# Patient Record
Sex: Male | Born: 1980 | Race: White | Hispanic: Yes | Marital: Married | Smoking: Never smoker
Health system: Southern US, Community
[De-identification: ages and names within clinical notes are randomized; demographics above are authoritative.]

## PROBLEM LIST (undated history)

## (undated) DIAGNOSIS — K602 Anal fissure, unspecified: Secondary | ICD-10-CM

## (undated) DIAGNOSIS — K37 Unspecified appendicitis: Secondary | ICD-10-CM

## (undated) HISTORY — PX: GANGLION CYST EXCISION: SHX1691

## (undated) HISTORY — PX: KNEE ARTHROSCOPY W/ ACL RECONSTRUCTION: SHX1858

## (undated) HISTORY — DX: Anal fissure, unspecified: K60.2

## (undated) HISTORY — DX: Unspecified appendicitis: K37

## (undated) HISTORY — PX: APPENDECTOMY: SHX54

---

## 2005-07-11 ENCOUNTER — Emergency Department (HOSPITAL_COMMUNITY): Admission: EM | Admit: 2005-07-11 | Discharge: 2005-07-11 | Payer: Self-pay | Admitting: Emergency Medicine

## 2009-08-09 ENCOUNTER — Telehealth: Payer: Self-pay | Admitting: Internal Medicine

## 2009-08-12 ENCOUNTER — Ambulatory Visit: Payer: Self-pay | Admitting: Internal Medicine

## 2009-08-12 DIAGNOSIS — R634 Abnormal weight loss: Secondary | ICD-10-CM

## 2009-08-12 DIAGNOSIS — K625 Hemorrhage of anus and rectum: Secondary | ICD-10-CM

## 2009-08-12 DIAGNOSIS — K5900 Constipation, unspecified: Secondary | ICD-10-CM | POA: Insufficient documentation

## 2009-08-12 DIAGNOSIS — R143 Flatulence: Secondary | ICD-10-CM

## 2009-08-12 DIAGNOSIS — R142 Eructation: Secondary | ICD-10-CM

## 2009-08-12 DIAGNOSIS — R1032 Left lower quadrant pain: Secondary | ICD-10-CM | POA: Insufficient documentation

## 2009-08-12 DIAGNOSIS — R141 Gas pain: Secondary | ICD-10-CM

## 2009-08-14 ENCOUNTER — Encounter: Payer: Self-pay | Admitting: Nurse Practitioner

## 2009-08-15 ENCOUNTER — Ambulatory Visit: Payer: Self-pay | Admitting: Internal Medicine

## 2009-08-15 ENCOUNTER — Encounter: Payer: Self-pay | Admitting: Internal Medicine

## 2009-08-19 ENCOUNTER — Telehealth (INDEPENDENT_AMBULATORY_CARE_PROVIDER_SITE_OTHER): Payer: Self-pay | Admitting: *Deleted

## 2009-08-19 ENCOUNTER — Encounter: Payer: Self-pay | Admitting: Internal Medicine

## 2009-08-21 ENCOUNTER — Telehealth: Payer: Self-pay | Admitting: Internal Medicine

## 2009-08-30 ENCOUNTER — Ambulatory Visit: Payer: Self-pay | Admitting: Internal Medicine

## 2009-08-30 ENCOUNTER — Telehealth (INDEPENDENT_AMBULATORY_CARE_PROVIDER_SITE_OTHER): Payer: Self-pay | Admitting: *Deleted

## 2009-08-30 DIAGNOSIS — K5150 Left sided colitis without complications: Secondary | ICD-10-CM | POA: Insufficient documentation

## 2009-09-10 DIAGNOSIS — R6889 Other general symptoms and signs: Secondary | ICD-10-CM | POA: Insufficient documentation

## 2009-09-27 ENCOUNTER — Ambulatory Visit: Payer: Self-pay | Admitting: Internal Medicine

## 2009-10-25 ENCOUNTER — Ambulatory Visit: Payer: Self-pay | Admitting: Internal Medicine

## 2009-10-29 ENCOUNTER — Encounter: Payer: Self-pay | Admitting: Internal Medicine

## 2009-12-24 ENCOUNTER — Ambulatory Visit: Payer: Self-pay | Admitting: Internal Medicine

## 2010-01-29 ENCOUNTER — Encounter (INDEPENDENT_AMBULATORY_CARE_PROVIDER_SITE_OTHER): Payer: Self-pay | Admitting: *Deleted

## 2010-03-27 ENCOUNTER — Ambulatory Visit: Payer: Self-pay | Admitting: Internal Medicine

## 2010-04-16 ENCOUNTER — Telehealth: Payer: Self-pay | Admitting: Internal Medicine

## 2010-04-17 ENCOUNTER — Encounter: Payer: Self-pay | Admitting: Internal Medicine

## 2010-04-17 ENCOUNTER — Telehealth (INDEPENDENT_AMBULATORY_CARE_PROVIDER_SITE_OTHER): Payer: Self-pay | Admitting: *Deleted

## 2010-04-25 ENCOUNTER — Telehealth (INDEPENDENT_AMBULATORY_CARE_PROVIDER_SITE_OTHER): Payer: Self-pay | Admitting: *Deleted

## 2010-04-30 ENCOUNTER — Encounter: Payer: Self-pay | Admitting: Internal Medicine

## 2010-08-01 ENCOUNTER — Encounter: Payer: Self-pay | Admitting: Internal Medicine

## 2010-10-16 ENCOUNTER — Encounter: Payer: Self-pay | Admitting: Internal Medicine

## 2010-11-15 ENCOUNTER — Emergency Department (HOSPITAL_COMMUNITY): Admission: EM | Admit: 2010-11-15 | Discharge: 2010-11-15 | Payer: Self-pay | Admitting: Emergency Medicine

## 2010-12-01 ENCOUNTER — Ambulatory Visit: Payer: Self-pay | Admitting: Internal Medicine

## 2010-12-02 LAB — CONVERTED CEMR LAB
ALT: 25 units/L (ref 0–53)
AST: 26 units/L (ref 0–37)
Alkaline Phosphatase: 90 units/L (ref 39–117)
BUN: 12 mg/dL (ref 6–23)
Basophils Absolute: 0 10*3/uL (ref 0.0–0.1)
CRP, High Sensitivity: 1.39 (ref 0.00–5.00)
Creatinine, Ser: 0.9 mg/dL (ref 0.4–1.5)
Eosinophils Absolute: 0 10*3/uL (ref 0.0–0.7)
HCT: 42.7 % (ref 39.0–52.0)
Hemoglobin: 14.9 g/dL (ref 13.0–17.0)
Lymphs Abs: 2.3 10*3/uL (ref 0.7–4.0)
MCHC: 34.8 g/dL (ref 30.0–36.0)
Neutro Abs: 4.5 10*3/uL (ref 1.4–7.7)
Platelets: 242 10*3/uL (ref 150.0–400.0)
RDW: 13 % (ref 11.5–14.6)
Total Bilirubin: 0.6 mg/dL (ref 0.3–1.2)

## 2011-01-20 NOTE — Medication Information (Signed)
Summary: Patient Assistance Form/[employer]  Patient Assistance Form/[employer]   Imported By: Edmonia James 05/02/2010 09:07:31  _____________________________________________________________________  External Attachment:    Type:   Image     Comment:   External Document

## 2011-01-20 NOTE — Assessment & Plan Note (Signed)
Summary: FOLLOWUP Ulcerative colitis   History of Present Illness Visit Type: Follow-up Visit Primary GI MD: [name] MD Primary Provider: Silvestre Mesi, MD  Requesting Provider: n/a Chief Complaint: Colitis symptoms have improved, very little blood noticed by patient History of Present Illness:   30 year old with left-sided ulcerative colitis diagnosed in late August 2010. He has been on a combination of oral mesalamine, 4.8 g daily, and mesalamine enemas b.i.d. Since initiating medical therapy he has made ongoing clinical improvement. He was last evaluated in the office October 25, 2009. He has continued on medical therapy without problems. He continues to report improvement in his condition. He is having some formed bowel movements, although somewhat less consistency. No bleeding or abdominal pain. Rare mucus. No appreciable medication side effects. Also, stool antigen testing for Helicobacter pylori was negative (this to clarify blood tests ordered elsewhere).   GI Review of Systems      Denies abdominal pain, acid reflux, belching, bloating, chest pain, dysphagia with liquids, dysphagia with solids, heartburn, loss of appetite, nausea, vomiting, vomiting blood, weight loss, and  weight gain.      Reports change in bowel habits.     Denies anal fissure, black tarry stools, constipation, diarrhea, diverticulosis, fecal incontinence, heme positive stool, hemorrhoids, irritable bowel syndrome, jaundice, light color stool, liver problems, rectal bleeding, and  rectal pain.    Current Medications (verified): 1)  Advil 200 Mg Tabs (Ibuprofen) .... As Needed For Headaches 2)  Lialda 1.2 Gm  Tbec (Mesalamine) .... 2 P.o. Bid 3)  Mesalamine 4 Gm  Enem (Mesalamine) .... Instill Per Rectum Every Am and Every Pm @ Bedtime 4)  Nyquil 60-7.05-19-999 Mg/47m Liqd (Pseudoeph-Doxylamine-Dm-Apap) .... As Needed For Cold and Sore Throat 5)  Ibuprofen 200 Mg Tabs (Ibuprofen) .... As  Needed  Allergies (verified): 1)  ! Codeine  Past History:  Past Medical History: Reviewed history from 08/30/2009 and no changes required. Anal Fissure ulcerative colitis  Past Surgical History: Reviewed history from 08/12/2009 and no changes required. Unremarkable  Family History: Reviewed history from 08/12/2009 and no changes required. No FH of Colon Cancer: Family History of Liver Disease/Cirrhosis: aunt Family History of Breast Cancer:aunt Aunt- Viral Hepatits / cirrrhosis. Died at 41   Social History: married No children Patient has never smoked.  Alcohol Use - Very occasional ETOH Illicit Drug Use - no Occupation: Student  Review of Systems       review of systems entirely negative  Vital Signs:  Patient profile:   30year old male Height:      68 inches Weight:      169.25 pounds BMI:     25.83 Pulse rate:   64 / minute Pulse rhythm:   regular BP sitting:   100 / 68  (left arm) Cuff size:   regular  Vitals Entered By: June McMurray CThroop(Deborra Medina (December 24, 2009 11:48 AM)  Physical Exam  General:  Well developed, well nourished, no acute distress. Head:  Normocephalic and atraumatic. Eyes:  PERRLA, no icterus. Mouth:  No deformity or lesions, dentition normal. Neck:  Supple; no masses or thyromegaly. Lungs:  Clear throughout to auscultation. Heart:  Regular rate and rhythm; no murmurs, rubs,  or bruits. Abdomen:  Soft, nontender and nondistended. No masses, hepatosplenomegaly or hernias noted. Normal bowel sounds. Pulses:  Normal pulses noted. Extremities:  no edema Neurologic:  Alert and  oriented x4; Skin:  Intact without significant lesions or rashes.. No jaundice Psych:  Alert and cooperative. Normal mood  and affect.   Impression & Recommendations:  Problem # 1:  ULCERATIVE COLITIS-LEFT SIDE (ICD-556.5) left-sided ulcerative colitis diagnosed late August 2010. Ongoing and significant clinical improvement on mesalamine  therapies.  Plan: #1. Continue Lialda 2.4 g b.i.d. #2. Change mesalamine enemas to one q.h.s. for 4 weeks, then discontinue if doing well clinically. #3. Routine office followup in 2 months. Contact the office in the interim for any questions or problems.  Problem # 2:  OTHER ABNORMAL CLINICAL FINDING (ZCH-885.0) abnormal Helicobacter pylori antibody in a patient from and an endemic area. No relevant symptoms. Stool antigen testing negative confirming false positive serology. Discussed with patient. No further workup or treatment indicated.  Patient Instructions: 1)  copy: Dr. Silvestre Mesi

## 2011-01-20 NOTE — Letter (Signed)
Summary: Generic Letter  [facility]  [place], [place] [address]   Phone: [phone]  Fax: [phone]    03/27/2010  [address] [place], [place]  [address]  To Whom It May Concern:  Mr. Alvira Monday is under Dr. Blanch Media care for the treatment of Ulcerative Colitis. Enclosed is a current medication list for the treatment of the above medical condition.   Sincerely,   [name], MD

## 2011-01-20 NOTE — Progress Notes (Signed)
  Phone Note Call from Patient   Summary of Call: Pt. brought completed application for [employer] program for Lialda. Apllication faxed to [place]. Initial call taken by: Abel Presto RN,  Apr 25, 2010 4:14 PM

## 2011-01-20 NOTE — Progress Notes (Signed)
Summary: Lialda  Phone Note Call from Patient Call back at Home Phone [phone]   Caller: Patient Call For: Dr. Henrene Pastor Reason for Call: Talk to Nurse Summary of Call: ins will not cover Lialda at 100% per pt... would like something comparable that his ins will cover Initial call taken by: Lucien Mons,  April 16, 2010 9:47 AM  Follow-up for Phone Call        Pt. instructed to call insurance and see which med. they will cover.  Abel Presto RN  April 16, 2010 9:56 AM  Pt's insurance will only pay so much a year toward name brand drugs and he has all ready exceeded it  on the [place]. Wants a generic equivalent as  the Lialda would cost $500.00 a month. Follow-up by: Abel Presto RN,  April 16, 2010 11:02 AM  Additional Follow-up for Phone Call Additional follow up Details #1::        there is no generic equivalent that I am familiar with. Please correct me if I am mistaken. By the way, sulfasalazine is not a generic equivalent. Additional Follow-up by: Irene Shipper MD,  April 16, 2010 11:18 AM    Additional Follow-up for Phone Call Additional follow up Details #2::    Pt. given discount card to take to pharmacy  and he willl call me back to see if it will be honored.Informed there is no  generic substitute.   Abel Presto RN  April 16, 2010 12:17 PM Pt. took discount card to [place] will still cost him $200.00 a month  as he is going to school and not working.Requesting to go back on mesalamine enema 1 Q.HS. Follow-up by: Abel Presto RN,  April 16, 2010 1:57 PM  Additional Follow-up for Phone Call Additional follow up Details #3:: Details for Additional Follow-up Action Taken: see if Asacol or Asacol HD are options. I wouldn't favor chronic topical therapy alone Additional Follow-up by: Irene Shipper MD,  April 16, 2010 2:17 PM  Pt. will call insurance co. about Asacol coverage.   Abel Presto RN  April 16, 2010 2:23 PM

## 2011-01-20 NOTE — Progress Notes (Signed)
  Phone Note Outgoing Call   Summary of Call: Call placed to [facility] cares per rec. of [employer] [place]. rep. and pt. will qualify for asst.program according to financial guidelines.He will come by tomorrow for the application.

## 2011-01-20 NOTE — Medication Information (Signed)
Summary: Lialda Approved/[employer]  Lialda/[employer]   Imported By: Phillis Knack 05/07/2010 14:34:29  _____________________________________________________________________  External Attachment:    Type:   Image     Comment:   External Document

## 2011-01-20 NOTE — Letter (Signed)
Summary: Office Visit Letter  [facility]  [address] [place], [place] [address]   Phone: [phone]  Fax: [phone]      January 29, 2010 MRN: [id number]   GI[address] FOR[address] [place], [place]  [address]   Dear Mr. Alvira Monday,   According to our records, it is time for you to schedule a follow-up office visit with [place] in the month of March 2011.   At your convenience, please call [phone] (option #2)to schedule an office visit. If you have any questions, concerns, or feel that this letter is in error, we would appreciate your call.   Sincerely,  Docia Chuck. Henrene Pastor, M.D.   [facility] Gastroenterology Division [phone]

## 2011-01-20 NOTE — Medication Information (Signed)
Summary: Lialda/[employer]  Lialda/[employer]   Imported By: Phillis Knack 08/07/2010 12:22:41  _____________________________________________________________________  External Attachment:    Type:   Image     Comment:   External Document

## 2011-01-20 NOTE — Medication Information (Signed)
Summary: [employer]  [employer]   Imported By: Bubba Hales 10/23/2010 10:24:38  _____________________________________________________________________  External Attachment:    Type:   Image     Comment:   External Document

## 2011-01-20 NOTE — Assessment & Plan Note (Signed)
Summary: Followup - left-sided UC   History of Present Illness Visit Type: Follow-up Visit Primary GI MD: [name] MD Primary Provider: [name], MD  Requesting Provider: n/a Chief Complaint: F/u for ulcerative colitis.  Pt states that he feels good and denies any GI complaints  History of Present Illness:   30 year old with left-sided ulcerative colitis diagnosed in late August 2010. He has been on a combination of oral mesalamine, 4.8 g daily, and mesalamine enemas at night. He was last seen in January. Since that time he continues to do well. He reports 2 formed bowel movements daily. No blood or mucus except for one occasion. No abdominal pain. His weight has returned to baseline. He continues to use NSAIDs sporadically. He is going to [place] for one month. He asks if he can stop mesalamine enemas. He was instructed previous that he could.   GI Review of Systems      Denies abdominal pain, acid reflux, belching, bloating, chest pain, dysphagia with liquids, dysphagia with solids, heartburn, loss of appetite, nausea, vomiting, vomiting blood, weight loss, and  weight gain.        Denies anal fissure, black tarry stools, change in bowel habit, constipation, diarrhea, diverticulosis, fecal incontinence, heme positive stool, hemorrhoids, irritable bowel syndrome, jaundice, light color stool, liver problems, rectal bleeding, and  rectal pain.    Current Medications (verified): 1)  Advil 200 Mg Tabs (Ibuprofen) .... As Needed For Headaches 2)  Lialda 1.2 Gm  Tbec (Mesalamine) .... 2 P.o. Bid 3)  Mesalamine 4 Gm  Enem (Mesalamine) .... Instill Per Rectum Every Am and Every Pm @ Bedtime 4)  Ibuprofen 200 Mg Tabs (Ibuprofen) .... As Needed  Allergies (verified): 1)  ! Codeine  Past History:  Past Medical History: Anal Fissure Ulcerative Colitis  Past Surgical History: Reviewed history from 08/12/2009 and no changes required. Unremarkable  Family History: Reviewed  history from 08/12/2009 and no changes required. No FH of Colon Cancer: Family History of Liver Disease/Cirrhosis: aunt Family History of Breast Cancer:aunt Aunt- Viral Hepatits / cirrrhosis. Died at 10.   Social History: Unemployed---------------Student Married  No children Patient has never smoked.  Alcohol Use - Very occasional ETOH Illicit Drug Use - no  Review of Systems  The patient denies allergy/sinus, anemia, anxiety-new, arthritis/joint pain, back pain, blood in urine, breast changes/lumps, change in vision, confusion, cough, coughing up blood, depression-new, fainting, fatigue, fever, headaches-new, hearing problems, heart murmur, heart rhythm changes, itching, muscle pains/cramps, night sweats, nosebleeds, shortness of breath, skin rash, sleeping problems, sore throat, swelling of feet/legs, swollen lymph glands, thirst - excessive, urination - excessive, urination changes/pain, urine leakage, vision changes, and voice change.    Vital Signs:  Patient profile:   30 year old male Height:      68 inches Weight:      170 pounds BMI:     25.94 Pulse rate:   60 / minute Pulse rhythm:   regular BP sitting:   110 / 76  (left arm) Cuff size:   regular  Vitals Entered By: Hope Pigeon CMA (March 27, 2010 11:32 AM)  Physical Exam  General:  Well developed, well nourished, no acute distress. Head:  Normocephalic and atraumatic. Eyes:  PERRLA, no icterus. Mouth:  No deformity or lesions, dentition normal. Lungs:  Clear throughout to auscultation. Heart:  Regular rate and rhythm; no murmurs, rubs,  or bruits. Abdomen:  Soft, nontender and nondistended. No masses, hepatosplenomegaly or hernias noted. Normal bowel sounds. Msk:  Symmetrical with no gross deformities. Normal posture. Pulses:  Normal pulses noted. Extremities:  No clubbing, cyanosis, edema or deformities noted. Neurologic:  Alert and  oriented x4;   Skin:  no jaundice Psych:  Alert and cooperative. Normal mood  and affect.   Impression & Recommendations:  Problem # 1:  ULCERATIVE COLITIS-LEFT SIDE (ICD-556.5) Left-sided ulcerative colitis. Clinically doing well on mesalamine therapies.  Plan #1. Continue Lialda 2.4 g b.i.d. #2. Discontinue mesalamine enemas #3. Given medical note stating that he may travel with his medication #4. No NSAIDs. May use Tylenol or Tylenol products as directed p.r.n. #5. Routine GI office followup in 6 months. Contact the office in the interim for questions or problems.  Patient Instructions: 1)  Continue taking Lialda. 2)  Stop Enemas. 3)  Do not use Advil/Ibuprofen. 4)  You can take Tylenol. 5)  Please schedule a follow-up appointment in 6 months. 6)  The medication list was reviewed and reconciled.  All changed / newly prescribed medications were explained.  A complete medication list was provided to the patient / caregiver. 7)  pritned and given to patient. Randye Lobo [facility]  March 27, 2010 11:51 AM 8)  copy: Dr. Silvestre Mesi

## 2011-01-22 NOTE — Assessment & Plan Note (Signed)
Summary: 6 month followup-left-sided UC   History of Present Illness Visit Type: follow up Primary GI MD: George Shorts MD Primary Provider: Gay Morrison. Copland, MD  Requesting Provider: n/a Chief Complaint: Follow up Ulcerative Colitis. Pt has some intermittant cramping with loose and sometimes watery BM's. Pt denies any abd pain. History of Present Illness:   30 year old with left-sided ulcerative colitis diagnosed in August of 2010. He was last evaluated March 27, 2010. Since that time he has continued on oral mesalamine 4.8 g daily. He reports no significant problems since his last visit. He did have a day or 2 with intermittent abdominal cramping and loose stools. Overall though, 2 formed bowel movements per day without mucus or blood. No abdominal pain. Stable weight.   GI Review of Systems      Denies abdominal pain, acid reflux, belching, bloating, chest pain, dysphagia with liquids, dysphagia with solids, heartburn, loss of appetite, nausea, vomiting, vomiting blood, weight loss, and  weight gain.        Denies anal fissure, black tarry stools, change in bowel habit, constipation, diarrhea, diverticulosis, fecal incontinence, heme positive stool, hemorrhoids, irritable bowel syndrome, jaundice, light color stool, liver problems, rectal bleeding, and  rectal pain.    Current Medications (verified): 1)  Advil 200 Mg Tabs (Ibuprofen) .... As Needed For Headaches 2)  Lialda 1.2 Gm  Tbec (Mesalamine) .... 2 P.o. Bid 3)  Ibuprofen 200 Mg Tabs (Ibuprofen) .... As Needed  Allergies (verified): 1)  ! Codeine  Past History:  Past Medical History: Reviewed history from 03/27/2010 and no changes required. Anal Fissure Ulcerative Colitis  Past Surgical History: Reviewed history from 08/12/2009 and no changes required. Unremarkable  Family History: Reviewed history from 08/12/2009 and no changes required. No FH of Colon Cancer: Family History of Liver Disease/Cirrhosis: aunt Family  History of Breast Cancer:aunt Aunt- Viral Hepatits / cirrrhosis. Died at 48.   Social History: Reviewed history from 03/27/2010 and no changes required. Unemployed---------------Student Married  No children Patient has never smoked.  Alcohol Use - Very occasional ETOH Illicit Drug Use - no  Review of Systems  The patient denies allergy/sinus, anemia, anxiety-new, arthritis/joint pain, back pain, blood in urine, breast changes/lumps, change in vision, confusion, cough, coughing up blood, depression-new, fainting, fatigue, fever, headaches-new, hearing problems, heart murmur, heart rhythm changes, itching, menstrual pain, muscle pains/cramps, night sweats, nosebleeds, pregnancy symptoms, shortness of breath, skin rash, sleeping problems, sore throat, swelling of feet/legs, swollen lymph glands, thirst - excessive , urination - excessive , urination changes/pain, urine leakage, vision changes, and voice change.    Vital Signs:  Patient profile:   30 year old male Height:      68 inches Weight:      174.25 pounds BMI:     26.59 Pulse rate:   66 / minute Pulse rhythm:   regular BP sitting:   116 / 68  (left arm) Cuff size:   regular  Vitals Entered By: George Morrison CMA George Morrison) (December 01, 2010 4:09 PM)  Physical Exam  General:  Well developed, well nourished, no acute distress. Head:  Normocephalic and atraumatic. Eyes:  PERRLA, no icterus. Mouth:  No deformity or lesions, dentition normal. Lungs:  Clear throughout to auscultation. Heart:  Regular rate and rhythm; no murmurs, rubs,  or bruits. Abdomen:  Soft, nontender and nondistended. No masses, hepatosplenomegaly or hernias noted. Normal bowel sounds. Pulses:  Normal pulses noted. Extremities:  edema Neurologic:  alert origin Skin:  no jaundice Psych:  Alert and cooperative.  Normal mood and affect.   Impression & Recommendations:  Problem # 1:  ULCERATIVE COLITIS-LEFT SIDE (ICD-556.5) clinically doing  well  Plan: #1. Continue Lialda 2.4 g b.i.d. Prescription with multiple refills submitted #2. CBC, comprehensive metabolic panel, and CRP today #3. Routine GI followup in 6 months. Sooner for questions or clinical problems.  Other Orders: TLB-CBC Platelet - w/Differential (85025-CBCD) TLB-CRP-High Sensitivity (C-Reactive Protein) (86140-FCRP) TLB-CMP (Comprehensive Metabolic Pnl) (15176-HYWV)  Patient Instructions: 1)  Go to basement floor today and have labs drawn. 2)  Refilled Lialda Rx. sent to [facility]. 3)  Please schedule a follow-up appointment in 6 months. 4)  The medication list was reviewed and reconciled.  All changed / newly prescribed medications were explained.  A complete medication list was provided to the patient / caregiver. Prescriptions: LIALDA 1.2 GM  TBEC (MESALAMINE) 2 p.o. bid  #360 x 4   Entered by:   Randye Lobo NCMA   Authorized by:   Irene Shipper MD   Signed by:   Randye Lobo NCMA on 12/01/2010   Method used:   Electronically to        [facility]* (retail)       [address]       [place]       [place], [place]  [address]       Ph: [phone]       Fax: [phone]   RxID:   [id number]

## 2011-01-22 NOTE — Medication Information (Signed)
Summary: Pt's assistance forms for Lialda/[employer]  Pt's assistance forms for Lialda/[employer]   Imported By: Phillis Knack 12/08/2010 10:27:37  _____________________________________________________________________  External Attachment:    Type:   Image     Comment:   External Document

## 2011-01-30 ENCOUNTER — Telehealth: Payer: Self-pay | Admitting: Internal Medicine

## 2011-02-05 NOTE — Progress Notes (Signed)
Summary: Medication refill  Phone Note Call from Patient Call back at Home Phone [phone]   Caller: Patient Call For: Dr. Henrene Pastor Reason for Call: Talk to Nurse Summary of Call: refill on lialda to [facility] outpatient pharmacy, patient would like a 3 month supply Initial call taken by: Martinique Johnson,  January 30, 2011 3:34 PM    Prescriptions: LIALDA 1.2 GM  TBEC (MESALAMINE) 2 p.o. bid  #360 x 4   Entered by:   Randye Lobo NCMA   Authorized by:   Irene Shipper MD   Signed by:   Randye Lobo NCMA on 01/30/2011   Method used:   Electronically to        [facility]* (retail)       [address]       [place]       [place], [place]  [address]       Ph: [phone]       Fax: [phone]   RxID:   [id number]

## 2011-05-15 ENCOUNTER — Telehealth: Payer: Self-pay | Admitting: Internal Medicine

## 2011-05-15 NOTE — Telephone Encounter (Signed)
Pt aware of Dr. Ardis Hughs recommendations. Pt states he has 5 boxes of the Rowasa enemas already and he will use those. Pt knows to call back if no improvement within a week. Appt made for pt to see Dr. Henrene Pastor 06/12/11@1 :30pm. Pt verbalized understanding.

## 2011-05-15 NOTE — Telephone Encounter (Signed)
Had left sided colitis per Dr. Per office note December 2011. Let's try him on Rowasa enema, once nightly at bed. He is given 30 of these with 2 refills. He should call if his symptoms worsen or do not improve within the next week or so. He should also have follow up appointment Dr. Henrene Pastor in 2-3 weeks.

## 2011-05-15 NOTE — Telephone Encounter (Signed)
Pt states that he thinks he is having a flare of his ulcerative colitis. Pt states he has had mucous and blood in his stool for a week. Pt is currently taking Lialda 2.4gm BID. Dr. Ardis Hughs as doc of the day please advise.

## 2011-05-19 NOTE — Telephone Encounter (Signed)
Thanks

## 2011-05-30 ENCOUNTER — Encounter: Payer: Self-pay | Admitting: Gastroenterology

## 2011-06-05 ENCOUNTER — Encounter: Payer: Self-pay | Admitting: Internal Medicine

## 2011-06-08 ENCOUNTER — Encounter: Payer: Self-pay | Admitting: Internal Medicine

## 2011-06-08 ENCOUNTER — Ambulatory Visit (INDEPENDENT_AMBULATORY_CARE_PROVIDER_SITE_OTHER): Payer: 59 | Admitting: Internal Medicine

## 2011-06-08 VITALS — BP 120/84 | HR 76 | Temp 98.0°F | Resp 16 | Ht 68.0 in | Wt 172.2 lb

## 2011-06-08 DIAGNOSIS — M6740 Ganglion, unspecified site: Secondary | ICD-10-CM

## 2011-06-08 DIAGNOSIS — M67439 Ganglion, unspecified wrist: Secondary | ICD-10-CM | POA: Insufficient documentation

## 2011-06-08 NOTE — Assessment & Plan Note (Addendum)
I referred him to ortho (hand) surgery to consider removal

## 2011-06-08 NOTE — Progress Notes (Signed)
  Subjective:    Patient ID: George Morrison, male    DOB: November 04, 1981, 30 y.o.   MRN: [id number]  HPI New to me he complains of an uncomfortable cyst on the back of his left wrist for several years. It is slowly getting larger. 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    Review of Systems  Constitutional: Negative.   HENT: Negative.   Eyes: Negative.   Respiratory: Negative.   Cardiovascular: Negative.   Gastrointestinal: Negative.   Genitourinary: Negative.   Musculoskeletal: Negative.   Skin: Negative.   Neurological: Negative.   Hematological: Negative.   Psychiatric/Behavioral: Negative.        Objective:   Physical Exam  Vitals reviewed. Constitutional: He appears well-developed and well-nourished. No distress.  HENT:  Head: Normocephalic and atraumatic.  Right Ear: External ear normal.  Nose: Nose normal.  Mouth/Throat: No oropharyngeal exudate.  Eyes: Conjunctivae and EOM are normal. Pupils are equal, round, and reactive to light. Right eye exhibits no discharge. Left eye exhibits no discharge. No scleral icterus.  Neck: Normal range of motion. Neck supple. No JVD present. No tracheal deviation present. No thyromegaly present.  Cardiovascular: Normal rate, regular rhythm, normal heart sounds and intact distal pulses.  Exam reveals no gallop and no friction rub.   No murmur heard. Pulmonary/Chest: Effort normal and breath sounds normal. No stridor. No respiratory distress. He has no wheezes. He has no rales. He exhibits no tenderness.  Abdominal: Soft. Bowel sounds are normal. He exhibits no distension and no mass. There is no tenderness. There is no rebound and no guarding.  Musculoskeletal: Normal range of motion. He exhibits no edema and no tenderness.       On the dorsum of the left wrist there is a ganglion that measures about 2 cm, it is fixed to a tendon but it moves freely and there is no ttp, fluctuance, induration, or  abnormal skin.  Lymphadenopathy:    He has no cervical adenopathy.  Neurological: He is alert. He has normal reflexes.  Skin: Skin is warm and dry. No rash noted. He is not diaphoretic. No erythema. No pallor.  Psychiatric: He has a normal mood and affect. His behavior is normal. Judgment and thought content normal.          Assessment & Plan:

## 2011-06-08 NOTE — Patient Instructions (Signed)
Ganglion A ganglion is a swelling under the skin that is filled with a thick, jelly-like substance. It is a synovial cyst. This is caused by a break (rupture) of the joint lining from the joint space. A ganglion often occurs near an area of repeated minor trauma (damage caused by an accident). Trauma may also be a repetitive movement at work or in a sport. TREATMENT It often goes away without treatment. It may reappear later. Sometimes a ganglion may need to be surgically removed. Often they are drained and injected with a steroid. Sometimes they respond to:  Rest.   Occasionally splinting helps.  HOME CARE INSTRUCTIONS  Your caregiver will decide the best way of treating your ganglion. Do not try to break the ganglion yourself by pressing on it, poking it with a needle, or hitting it with a heavy object.   Use medications as directed.  1.  2. SEEK MEDICAL CARE IF:   The ganglion becomes larger or more painful.   You have increased redness or swelling.   You have weakness or numbness in your hand or wrist.  MAKE SURE YOU:   Understand these instructions.   Will watch your condition.   Will get help right away if you are not doing well or get worse.  Document Released: 12/04/2000 Document Re-Released: 03/05/2009 [facility] Patient Information 2011 [place].

## 2011-06-12 ENCOUNTER — Ambulatory Visit: Payer: Self-pay | Admitting: Internal Medicine

## 2011-07-08 ENCOUNTER — Other Ambulatory Visit: Payer: Self-pay | Admitting: Orthopedic Surgery

## 2011-07-08 ENCOUNTER — Ambulatory Visit (HOSPITAL_BASED_OUTPATIENT_CLINIC_OR_DEPARTMENT_OTHER)
Admission: RE | Admit: 2011-07-08 | Discharge: 2011-07-08 | Disposition: A | Discharge status: Home / Self Care | Payer: 59 | Source: Ambulatory Visit | Attending: Orthopedic Surgery | Admitting: Orthopedic Surgery

## 2011-07-08 DIAGNOSIS — Z01812 Encounter for preprocedural laboratory examination: Secondary | ICD-10-CM | POA: Insufficient documentation

## 2011-07-08 DIAGNOSIS — M6740 Ganglion, unspecified site: Secondary | ICD-10-CM | POA: Insufficient documentation

## 2011-07-08 LAB — POCT HEMOGLOBIN-HEMACUE: Hemoglobin: 14.4 g/dL (ref 13.0–17.0)

## 2011-07-13 NOTE — Op Note (Signed)
NAMEGenice Rouge NO.:  [id number]  MEDICAL RECORD NO.:  [id number]  LOCATION:                                 FACILITY:  PHYSICIAN:  Melrose Nakayama, MD       DATE OF BIRTH:  DATE OF PROCEDURE:  07/08/2011 DATE OF DISCHARGE:                              OPERATIVE REPORT   PREOPERATIVE DIAGNOSIS:  Left wrist dorsal mass.  POSTOPERATIVE DIAGNOSIS:  Left wrist dorsal mass.  ATTENDING PHYSICIAN:  Melrose Nakayama, MD, who scrubbed and present for the entire procedure.  ASSISTANT SURGEON:  None.  SURGICAL PROCEDURES: 1. Left wrist deep mass removal greater than 3 cm subfascial mass. 2. Left wrist joint arthrotomy and exploration.  ANESTHESIA:  General via LMA.  TOURNIQUET TIME:  Less than 30 minutes at 250 mmHg.  INTRAOPERATIVE SPECIMENS:  Dorsal mass to pathology.  SURGICAL AND INDICATIONS:  [name] is a right-hand dominant 30 year old gentleman with recurrent mass over the dorsal aspect of his wrist. The patient elected to undergo removal.  Risks, benefits and alternatives were discussed in detail with the patient, and signed informed consent was obtained.  Risks include but not limited bleeding, infection, damage to nearby nerves, arteries or tendons, recurrence of the mass and need for further surgical intervention.  DESCRIPTION OF PROCEDURE:  The patient was appropriately identified in the preop holding area, a mark with permanent marker was made on the left wrist to indicate the correct operative site.  The patient was then brought back to the operating room and placed supine on the anesthesia table where general anesthesia was administered.  The patient tolerated this well.  Well-padded tourniquet was then placed in the left brachium and sealed with 1000 drape.  Left upper extremity was prepped and draped in normal sterile fashion.  Time-out was called, the correct side was identified and the procedure begun.  Attention was then  turned to the left wrist where a longitudinal incision was made directly over the mass.  The limb was elevated and tourniquet insufflated.  Dissection was then carried down through subcutaneous tissues.  Subcutaneous veins were then carefully retracted.  Dissection was then carried down between the extensor interval down to the wrist joint above the fascial layer. Following this, a circumferential dissection was then carried out of the approximately 3 cm mass.  Once circumferential dissection was then carried around the mass, the mass was traced all the way down to its stalk into the joint.  Joint arthrotomy was then carried out.  This was the mass originating from the SL interval.  The joint was then excised at the base of the originating stalk and sent to pathology.  Further joint exploration and wound exploration and joint evaluation was evaluated.  No other abnormalities noted within the joint.  The patient did have some mild synovitis around the area, but no other soft tissue abnormalities noted.  Following mass removal with the joint capsulotomy and arthrotomy, skin was then closed loosely with a 3-0 Monocryl suture. The subcutaneous tissues were then closed with 3-0 Monocryl, 4-0 Monocryl suture and skin was closed with interrupted 4-0 Prolene sutures.  Adaptic  dressing and sterile compressive bandage were then applied.  The patient was placed in a well-padded volar splint. Extubated and taken to recovery room in good condition.  POSTOPERATIVE PLAN:  The patient was discharged home, seen back in the office in approximately 2 weeks for wound check, suture removal, transition to short-arm wrist brace and then begin gradually use in activity.     Melrose Nakayama, MD     FWO/MEDQ  D:  07/08/2011  T:  07/09/2011  Job:  [id number]  Electronically Signed by Iran Planas IV MD on 07/13/2011 09:58:12 PM

## 2012-04-13 ENCOUNTER — Telehealth: Payer: Self-pay

## 2012-04-13 MED ORDER — MESALAMINE 1.2 G PO TBEC: DELAYED_RELEASE_TABLET | ORAL | Status: DC

## 2012-04-13 NOTE — Telephone Encounter (Signed)
refill request for lialda

## 2012-05-03 ENCOUNTER — Other Ambulatory Visit (INDEPENDENT_AMBULATORY_CARE_PROVIDER_SITE_OTHER): Payer: 59

## 2012-05-03 ENCOUNTER — Encounter: Payer: Self-pay | Admitting: Internal Medicine

## 2012-05-03 ENCOUNTER — Other Ambulatory Visit: Payer: Self-pay | Admitting: Internal Medicine

## 2012-05-03 ENCOUNTER — Ambulatory Visit (INDEPENDENT_AMBULATORY_CARE_PROVIDER_SITE_OTHER): Payer: Self-pay | Admitting: Internal Medicine

## 2012-05-03 DIAGNOSIS — K5190 Ulcerative colitis, unspecified, without complications: Secondary | ICD-10-CM

## 2012-05-03 LAB — CBC WITH DIFFERENTIAL/PLATELET
Basophils Relative: 0.3 % (ref 0.0–3.0)
Eosinophils Relative: 0.6 % (ref 0.0–5.0)
HCT: 46.7 % (ref 39.0–52.0)
Hemoglobin: 15.6 g/dL (ref 13.0–17.0)
Lymphs Abs: 2.3 10*3/uL (ref 0.7–4.0)
Monocytes Relative: 6.5 % (ref 3.0–12.0)
Neutro Abs: 3.5 10*3/uL (ref 1.4–7.7)
Platelets: 250 10*3/uL (ref 150.0–400.0)
RBC: 5.03 Mil/uL (ref 4.22–5.81)
WBC: 6.3 10*3/uL (ref 4.5–10.5)

## 2012-05-03 LAB — COMPREHENSIVE METABOLIC PANEL
ALT: 26 U/L (ref 0–53)
AST: 23 U/L (ref 0–37)
Albumin: 4.4 g/dL (ref 3.5–5.2)
Alkaline Phosphatase: 93 U/L (ref 39–117)
BUN: 15 mg/dL (ref 6–23)
CO2: 28 mEq/L (ref 19–32)
Calcium: 9.7 mg/dL (ref 8.4–10.5)
Chloride: 104 mEq/L (ref 96–112)
Creatinine, Ser: 0.8 mg/dL (ref 0.4–1.5)
GFR: 118.98 mL/min (ref >60.00)
Glucose, Bld: 95 mg/dL (ref 70–99)
Potassium: 4.7 mEq/L (ref 3.5–5.1)
Sodium: 139 mEq/L (ref 135–145)
Total Bilirubin: 0.9 mg/dL (ref 0.3–1.2)
Total Protein: 8 g/dL (ref 6.0–8.3)

## 2012-05-03 LAB — SEDIMENTATION RATE: Sed Rate: 6 mm/hr (ref 0–22)

## 2012-05-03 MED ORDER — MESALAMINE 4 G RE ENEM: 4.0000 g | ENEMA | Freq: Every day | RECTAL | Status: DC

## 2012-05-03 MED ORDER — MESALAMINE 1.2 G PO TBEC: DELAYED_RELEASE_TABLET | ORAL | Status: DC

## 2012-05-03 NOTE — Progress Notes (Signed)
HISTORY OF PRESENT ILLNESS:  George Morrison is a 31 y.o. male with ulcerative colitis involving the distal 30 cm of the colon as well as focal colitis in the region of the hepatic flexure. This was diagnosed August 2010. History of medical therapies and improved. He did not require steroids. He has not been seen since December of 2011. At that time he was doing well. He presents today after having requested a refill of his medication, and realizing that had been over a year since his last visit. He was to have returned about 1 year ago. In any event, he is doing well. He takes Lialda 2.4 g twice a day. Occasionally, Rowasa enemas for short episodes of mucoid stools with minimal bleeding. Overall he reports that he has been doing extremely well. George Morrison are occasional increased intestinal gas, and rare episodes of mucus and minor blood for 2-3 days. His weight is stable. No interval medical problems. He continues to be active playing soccer.  REVIEW OF SYSTEMS:  All non-GI ROS negative except for back pain and headaches  Past Medical History  Diagnosis Date  . Anal fissure   . Ulcerative colitis     Past Surgical History  Procedure Date  . Ganglion cyst excision     left hand    Social History George Morrison  reports that he has never smoked. He has never used smokeless tobacco. He reports that he does not drink alcohol or use illicit drugs.  family history includes Cancer in his other; Cirrhosis in his other; Hepatitis in his other; and Liver disease in his other.  Allergies  Allergen Reactions  . Codeine        PHYSICAL EXAMINATION: Vital signs: BP 104/62  Pulse 62  Ht 5' 8"  (1.727 m)  Wt 169 lb 2 oz (76.715 kg)  BMI 25.72 kg/m2 General: Well-developed, well-nourished, no acute distress HEENT: Sclerae are anicteric, conjunctiva pink. Oral mucosa intact Lungs: Clear Heart: Regular Abdomen: soft, nontender, nondistended, no obvious ascites, no peritoneal signs,  normal bowel sounds. No organomegaly. Extremities: No edema Psychiatric: alert and oriented x3. Cooperative      ASSESSMENT:  #1. Ulcerative colitis. Quiescent on mesalamine   PLAN:  #1. Continue Lialda at current dosage. Prescription refilled for one year #2. Rowasa enemas when necessary. Prescription refilled #3. Blood work today including CBC, comprehensive metabolic panel, and erythrocyte sedimentation rate #4. Routine office followup in 1 year. Sooner if needed  ADDENDUM Blood work has returned today. It is entirely normal.

## 2012-05-03 NOTE — Patient Instructions (Signed)
Your physician has requested that you go to the basement for lab work before leaving today  We have sent the following medications to your pharmacy for you to pick up at your convenience:  Lialda, Rowasa enemas.  Please follow up in one year

## 2012-05-11 ENCOUNTER — Telehealth: Payer: Self-pay

## 2012-05-11 ENCOUNTER — Other Ambulatory Visit: Payer: Self-pay

## 2012-05-11 NOTE — Telephone Encounter (Signed)
called in rx for Lialda;

## 2012-10-23 ENCOUNTER — Emergency Department (HOSPITAL_COMMUNITY): Payer: 59

## 2012-10-23 ENCOUNTER — Emergency Department (HOSPITAL_COMMUNITY)
Admission: EM | Admit: 2012-10-23 | Discharge: 2012-10-23 | Disposition: A | Discharge status: Home / Self Care | Payer: 59 | Attending: Emergency Medicine | Admitting: Emergency Medicine

## 2012-10-23 ENCOUNTER — Encounter (HOSPITAL_COMMUNITY): Payer: Self-pay

## 2012-10-23 DIAGNOSIS — K5190 Ulcerative colitis, unspecified, without complications: Secondary | ICD-10-CM | POA: Insufficient documentation

## 2012-10-23 DIAGNOSIS — Z79899 Other long term (current) drug therapy: Secondary | ICD-10-CM | POA: Insufficient documentation

## 2012-10-23 DIAGNOSIS — Y9289 Other specified places as the place of occurrence of the external cause: Secondary | ICD-10-CM | POA: Insufficient documentation

## 2012-10-23 DIAGNOSIS — Y9366 Activity, soccer: Secondary | ICD-10-CM | POA: Insufficient documentation

## 2012-10-23 DIAGNOSIS — X503XXA Overexertion from repetitive movements, initial encounter: Secondary | ICD-10-CM | POA: Insufficient documentation

## 2012-10-23 DIAGNOSIS — K602 Anal fissure, unspecified: Secondary | ICD-10-CM | POA: Insufficient documentation

## 2012-10-23 DIAGNOSIS — S43006A Unspecified dislocation of unspecified shoulder joint, initial encounter: Secondary | ICD-10-CM | POA: Insufficient documentation

## 2012-10-23 MED ORDER — ONDANSETRON HCL 4 MG/2ML IJ SOLN: 4.0000 mg | Freq: Once | INTRAMUSCULAR | Status: DC

## 2012-10-23 MED ORDER — ONDANSETRON HCL 4 MG/2ML IJ SOLN
INTRAMUSCULAR | Status: AC
  Filled 2012-10-23: qty 2

## 2012-10-23 MED ORDER — FENTANYL CITRATE 0.05 MG/ML IJ SOLN
50.0000 ug | Freq: Once | INTRAMUSCULAR | Status: AC
  Administered 2012-10-23: 50 ug via INTRAVENOUS
  Filled 2012-10-23: qty 2

## 2012-10-23 MED ORDER — LORAZEPAM 2 MG/ML IJ SOLN
1.0000 mg | Freq: Once | INTRAMUSCULAR | Status: AC
  Administered 2012-10-23: 1 mg via INTRAVENOUS
  Filled 2012-10-23: qty 1

## 2012-10-23 MED ORDER — ONDANSETRON HCL 4 MG/2ML IJ SOLN
4.0000 mg | Freq: Once | INTRAMUSCULAR | Status: AC
  Administered 2012-10-23: 4 mg via INTRAVENOUS

## 2012-10-23 MED ORDER — HYDROMORPHONE HCL PF 1 MG/ML IJ SOLN
1.0000 mg | Freq: Once | INTRAMUSCULAR | Status: AC
  Administered 2012-10-23: 1 mg via INTRAVENOUS
  Filled 2012-10-23: qty 1

## 2012-10-23 MED ORDER — HYDROCODONE-ACETAMINOPHEN 5-500 MG PO TABS: 1.0000 | ORAL_TABLET | Freq: Four times a day (QID) | ORAL | Status: DC | PRN

## 2012-10-23 MED ORDER — ONDANSETRON HCL 8 MG PO TABS: 8.0000 mg | ORAL_TABLET | Freq: Three times a day (TID) | ORAL | Status: DC | PRN

## 2012-10-23 NOTE — ED Notes (Signed)
Pt sts he was playing soccer and fell on Left side with arm extended.  Presents with obvious deformity to Left shoulder.  Pt denies numbness or tingling in fingers.  Pain score 10/10.

## 2012-10-23 NOTE — ED Provider Notes (Signed)
History     CSN: [id number]  Arrival date & time 10/23/12  1215   First MD Initiated Contact with Patient 10/23/12 1304      Chief Complaint  Patient presents with  . Shoulder Injury    (Consider location/radiation/quality/duration/timing/severity/associated sxs/prior treatment) Patient is a 31 y.o. male presenting with shoulder injury. The history is provided by the patient.  Shoulder Injury Pertinent negatives include no chest pain, no abdominal pain, no headaches and no shortness of breath.  pt playing soccer, fell onto left shoulder w arm extended. C/o left shoulder pain. Constant, dull, mod-severe. Worse w movement. No numbness/weakness. Denies prior shoulder injury. Is right hand dominant. No head injury or loc. No neck or back pain.      Past Medical History  Diagnosis Date  . Anal fissure   . Ulcerative colitis     Past Surgical History  Procedure Date  . Ganglion cyst excision     left hand    Family History  Problem Relation Age of Onset  . Liver disease Other   . Cancer Other     Breast cancer/aunt  . Cirrhosis Other     aunt died at 56  . Hepatitis Other     viral hepatitis    History  Substance Use Topics  . Smoking status: Never Smoker   . Smokeless tobacco: Never Used  . Alcohol Use: Yes     Comment: very occasional      Review of Systems  Constitutional: Negative for fever.  HENT: Negative for neck pain.   Eyes: Negative for redness.  Respiratory: Negative for shortness of breath.   Cardiovascular: Negative for chest pain.  Gastrointestinal: Negative for abdominal pain.  Genitourinary: Negative for flank pain.  Musculoskeletal: Negative for back pain.  Skin: Negative for wound.  Neurological: Negative for weakness, numbness and headaches.  Hematological: Does not bruise/bleed easily.  Psychiatric/Behavioral: Negative for confusion.    Allergies  Codeine  Home Medications   Current Outpatient Rx  Name  Route  Sig  Dispense   Refill  . IBUPROFEN 200 MG PO TABS   Oral   Take 200 mg by mouth as needed.           Marland Kitchen MESALAMINE 1.2 G PO TBEC      Take 2 tablets by mouth twice a day   360 tablet   3     Patient needs office visit for further refills!!!   . MESALAMINE 4 G RE ENEM   Rectal   Place 60 mLs (4 g total) rectally at bedtime.   30 Bottle   3     BP 110/81  Pulse 76  Temp 97.5 F (36.4 C) (Oral)  Resp 18  SpO2 100%  Physical Exam  Nursing note and vitals reviewed. Constitutional: He is oriented to person, place, and time. He appears well-developed and well-nourished. No distress.  HENT:  Head: Atraumatic.  Eyes: Conjunctivae normal are normal.  Neck: Neck supple. No tracheal deviation present.  Cardiovascular: Normal rate, regular rhythm, normal heart sounds and intact distal pulses.   Pulmonary/Chest: Effort normal and breath sounds normal. No accessory muscle usage. No respiratory distress. He exhibits no tenderness.  Abdominal: Soft. He exhibits no distension. There is no tenderness.  Musculoskeletal: Normal range of motion.       Deformity left shoulder. Radial pulse 2+.  No other focal bony tenderness noted.   Neurological: He is alert and oriented to person, place, and time.  Motor intact bil. R/m/u nerve function intact.  Skin: Skin is warm and dry.  Psychiatric: He has a normal mood and affect.    ED Course  Reduction of dislocation Date/Time: 10/23/2012 3:03 PM Performed by: Mirna Mires Authorized by: Lajean Saver E Consent: Verbal consent obtained. Risks and benefits: risks, benefits and alternatives were discussed Consent given by: patient Local anesthesia used: yes Anesthesia: hematoma block Local anesthetic: lidocaine 1% with epinephrine Anesthetic total: 10 ml Patient sedated: no Patient tolerance: Patient tolerated the procedure well with no immediate complications.   (including critical care time)  Results for orders placed in visit on 05/03/12    CBC WITH DIFFERENTIAL      Component Value Range   WBC 6.3  4.5 - 10.5 K/uL   RBC 5.03  4.22 - 5.81 Mil/uL   Hemoglobin 15.6  13.0 - 17.0 g/dL   HCT 46.7  39.0 - 52.0 %   MCV 92.7  78.0 - 100.0 fl   MCHC 33.5  30.0 - 36.0 g/dL   RDW 14.0  11.5 - 14.6 %   Platelets 250.0  150.0 - 400.0 K/uL   Neutrophils Relative 55.6  43.0 - 77.0 %   Lymphocytes Relative 37.0  12.0 - 46.0 %   Monocytes Relative 6.5  3.0 - 12.0 %   Eosinophils Relative 0.6  0.0 - 5.0 %   Basophils Relative 0.3  0.0 - 3.0 %   Neutro Abs 3.5  1.4 - 7.7 K/uL   Lymphs Abs 2.3  0.7 - 4.0 K/uL   Monocytes Absolute 0.4  0.1 - 1.0 K/uL   Eosinophils Absolute 0.0  0.0 - 0.7 K/uL   Basophils Absolute 0.0  0.0 - 0.1 K/uL  COMPREHENSIVE METABOLIC PANEL      Component Value Range   Sodium 139  135 - 145 mEq/L   Potassium 4.7  3.5 - 5.1 mEq/L   Chloride 104  96 - 112 mEq/L   CO2 28  19 - 32 mEq/L   Glucose, Bld 95  70 - 99 mg/dL   BUN 15  6 - 23 mg/dL   Creatinine, Ser 0.8  0.4 - 1.5 mg/dL   Total Bilirubin 0.9  0.3 - 1.2 mg/dL   Alkaline Phosphatase 93  39 - 117 U/L   AST 23  0 - 37 U/L   ALT 26  0 - 53 U/L   Total Protein 8.0  6.0 - 8.3 g/dL   Albumin 4.4  3.5 - 5.2 g/dL   Calcium 9.7  8.4 - 10.5 mg/dL   GFR 118.98  >60.00 mL/min  SEDIMENTATION RATE      Component Value Range   Sed Rate 6  0 - 22 mm/hr   Dg Shoulder Left  10/23/2012  *RADIOLOGY REPORT*  Clinical Data: Status post reduction of left shoulder.  LEFT SHOULDER - 2+ VIEW  Comparison: 11/19/2012  Findings: There is no evidence of fracture or dislocation.  There is no evidence of arthropathy or other focal bone abnormality. Soft tissues are unremarkable.  IMPRESSION: Negative exam.   Original Report Authenticated By: Kerby Moors, M.D.    Dg Shoulder Left  10/23/2012  *RADIOLOGY REPORT*  Clinical Data: Left shoulder injury  LEFT SHOULDER - 2+ VIEW  Comparison: None  Findings: There is a inferior and anterior shoulder dislocation.  No fracture is  identified.  No radio-opaque foreign bodies or soft tissue calcification.  IMPRESSION:  Anterior and inferior shoulder dislocation.   Original Report Authenticated By: Kerby Moors,  M.D.       MDM  Iv ns. Dilaudid 1 mg iv.  Xrays.   Reduction of dislocation Date/Time: 10/23/2012 3:03 PM Performed by: Mirna Mires Authorized by: Lajean Saver E Consent: Verbal consent obtained. Risks and benefits: risks, benefits and alternatives were discussed Consent given by: patient Local anesthesia used: yes Anesthesia: hematoma block Local anesthetic: lidocaine 1% with epinephrine Anesthetic total: 10 ml Patient sedated: no Patient tolerance: Patient tolerated the procedure well with no immediate complications.  Pt given ativan 1 mg iv, dilaudid 1 mg iv, after sterile prep/betadine/alcohol injected left shoulder w lidocaine - -improved pain relief.  w Candis Schatz technique, able to reduce left shoulder without complication.  Recheck r/u/m nerve function intact, no numbness/weakness. Pain controlled.  Shoulder immobilizer placed by ortho tech. Ice pack.     Mirna Mires, MD 10/23/12 [phone]

## 2012-11-29 ENCOUNTER — Ambulatory Visit: Payer: 59 | Attending: Orthopaedic Surgery | Admitting: Physical Therapy

## 2012-11-29 DIAGNOSIS — M25619 Stiffness of unspecified shoulder, not elsewhere classified: Secondary | ICD-10-CM | POA: Insufficient documentation

## 2012-11-29 DIAGNOSIS — M25519 Pain in unspecified shoulder: Secondary | ICD-10-CM | POA: Insufficient documentation

## 2012-11-29 DIAGNOSIS — IMO0001 Reserved for inherently not codable concepts without codable children: Secondary | ICD-10-CM | POA: Insufficient documentation

## 2012-11-29 DIAGNOSIS — IMO0002 Reserved for concepts with insufficient information to code with codable children: Secondary | ICD-10-CM | POA: Insufficient documentation

## 2012-11-30 ENCOUNTER — Ambulatory Visit: Payer: 59 | Admitting: Physical Therapy

## 2012-12-05 ENCOUNTER — Ambulatory Visit: Payer: 59 | Admitting: Physical Therapy

## 2012-12-07 ENCOUNTER — Ambulatory Visit: Payer: 59 | Admitting: Physical Therapy

## 2012-12-08 ENCOUNTER — Ambulatory Visit: Payer: 59 | Admitting: Physical Therapy

## 2012-12-12 ENCOUNTER — Ambulatory Visit: Payer: 59 | Admitting: Physical Therapy

## 2012-12-13 ENCOUNTER — Encounter: Payer: 59 | Admitting: Physical Therapy

## 2012-12-19 ENCOUNTER — Ambulatory Visit: Payer: 59 | Admitting: Physical Therapy

## 2012-12-20 ENCOUNTER — Ambulatory Visit: Payer: 59 | Admitting: Physical Therapy

## 2012-12-26 ENCOUNTER — Telehealth: Payer: Self-pay | Admitting: Internal Medicine

## 2012-12-26 MED ORDER — MESALAMINE 4 G RE ENEM: 4.0000 g | ENEMA | Freq: Every day | RECTAL | Status: DC

## 2012-12-26 NOTE — Telephone Encounter (Signed)
Refilled Rowasa

## 2013-08-10 ENCOUNTER — Other Ambulatory Visit: Payer: Self-pay | Admitting: Internal Medicine

## 2014-01-12 ENCOUNTER — Ambulatory Visit (INDEPENDENT_AMBULATORY_CARE_PROVIDER_SITE_OTHER): Payer: 59 | Admitting: Internal Medicine

## 2014-01-12 ENCOUNTER — Encounter: Payer: Self-pay | Admitting: Internal Medicine

## 2014-01-12 VITALS — BP 100/68 | HR 64 | Ht 68.0 in | Wt 186.4 lb

## 2014-01-12 DIAGNOSIS — K5190 Ulcerative colitis, unspecified, without complications: Secondary | ICD-10-CM

## 2014-01-12 DIAGNOSIS — K625 Hemorrhage of anus and rectum: Secondary | ICD-10-CM

## 2014-01-12 MED ORDER — MOVIPREP 100 G PO SOLR: 1.0000 | Freq: Once | ORAL | Status: DC

## 2014-01-12 NOTE — Progress Notes (Signed)
HISTORY OF PRESENT ILLNESS:  George Morrison is a 33 y.o. male with chronic left-sided ulcerative colitis diagnosed August 2010 (colonoscopy with biopsies). He was subsequently treated with Lialda 4.8 g daily and Rowasa enemas. Last seen in the office May 2013. Doing well at that time. Presents now with infrequent but worsening bleeding. Soft stools 2-3 times per day. No abdominal pain or weight loss. He continues on Lialda. Recently took several Rowasa enemas with slight improvement. He is concerned and requires about colonoscopy to assess his disease.  REVIEW OF SYSTEMS:  All non-GI ROS negative   Past Medical History  Diagnosis Date  . Anal fissure   . Ulcerative colitis     Past Surgical History  Procedure Laterality Date  . Ganglion cyst excision      left hand    Social History George Morrison  reports that he has never smoked. He has never used smokeless tobacco. He reports that he drinks alcohol. He reports that he does not use illicit drugs.  family history includes Cancer in his other; Cirrhosis in his other; Hepatitis in his other; Liver disease in his other.  Allergies  Allergen Reactions  . Codeine Rash    dizzy        PHYSICAL EXAMINATION: Vital signs: BP 100/68  Pulse 64  Ht 5' 8"  (1.727 m)  Wt 186 lb 6 oz (84.539 kg)  BMI 28.34 kg/m2  Constitutional: generally well-appearing, no acute distress Psychiatric: alert and oriented x3, cooperative Eyes: extraocular movements intact, anicteric, conjunctiva pink Mouth: oral pharynx moist, no lesions Neck: supple no lymphadenopathy Cardiovascular: heart regular rate and rhythm, no murmur Lungs: clear to auscultation bilaterally Abdomen: soft, nontender, nondistended, no obvious ascites, no peritoneal signs, normal bowel sounds, no organomegaly Rectal: Deferred until colonoscopy Extremities: no lower extremity edema bilaterally Skin: no lesions on visible extremities Neuro: No focal deficits.   ASSESSMENT:  #1.  History of left-sided ulcerative colitis diagnosed August 2010. Has been maintained on chronic mesalamine therapy. Minor worsening of symptoms recently with bleeding and soft stools.   PLAN:  #1. Colonoscopy to reassess disease. Modify therapy thereafter as needed.The nature of the procedure, as well as the risks, benefits, and alternatives were carefully and thoroughly reviewed with the patient. Ample time for discussion and questions allowed. The patient understood, was satisfied, and agreed to proceed. The Movi prep prescribed. Patient instructed on his use

## 2014-01-12 NOTE — Patient Instructions (Signed)

## 2014-01-18 ENCOUNTER — Telehealth: Payer: Self-pay | Admitting: Internal Medicine

## 2014-01-18 NOTE — Telephone Encounter (Signed)
Give him prepopik (sp?). Lower volume.

## 2014-01-18 NOTE — Telephone Encounter (Signed)
Pts wife states pt had colon in the past and did not tolerate moviprep very well. Asking if pt can use a bottle of miralax and 54 ounces of gatorade instead. Colon is scheduled for 02/02/14. Please advise.

## 2014-01-19 NOTE — Telephone Encounter (Signed)
Wife aware and sample of prepopik left up front for pickup along with updated instructions. Wife states they will try it but if he has problems she will have him stop in and they will do miralax because she was not having him go through what he did 5 years ago.

## 2014-02-02 ENCOUNTER — Encounter: Payer: Self-pay | Admitting: Internal Medicine

## 2014-02-02 ENCOUNTER — Ambulatory Visit (AMBULATORY_SURGERY_CENTER): Payer: 59 | Admitting: Internal Medicine

## 2014-02-02 VITALS — BP 125/84 | HR 63 | Temp 96.9°F | Resp 18 | Ht 68.0 in | Wt 186.0 lb

## 2014-02-02 DIAGNOSIS — K5150 Left sided colitis without complications: Secondary | ICD-10-CM

## 2014-02-02 DIAGNOSIS — K625 Hemorrhage of anus and rectum: Secondary | ICD-10-CM

## 2014-02-02 DIAGNOSIS — K5190 Ulcerative colitis, unspecified, without complications: Secondary | ICD-10-CM

## 2014-02-02 MED ORDER — MESALAMINE 1.2 G PO TBEC: DELAYED_RELEASE_TABLET | ORAL | Status: DC

## 2014-02-02 MED ORDER — SODIUM CHLORIDE 0.9 % IV SOLN: 500.0000 mL | INTRAVENOUS | Status: DC

## 2014-02-02 NOTE — Progress Notes (Signed)
A/ox3 pleased with MAC, report to [employer]

## 2014-02-02 NOTE — Op Note (Signed)
[place]  [address] [place], [phone]   COLONOSCOPY PROCEDURE REPORT  PATIENT: George Morrison, George Morrison  MR#: [id number] BIRTHDATE: [birth year] , 32  yrs. old GENDER: Male ENDOSCOPIST: Eustace Quail, MD REFERRED BY:.  Self / Office PROCEDURE DATE:  02/02/2014 PROCEDURE:   Colonoscopy, diagnostic First Screening Colonoscopy - Avg.  risk and is 50 yrs.  old or older - No.  Prior Negative Screening - Now for repeat screening. N/A  History of Adenoma - Now for follow-up colonoscopy & has been > or = to 3 yrs.  N/A  Polyps Removed Today? No.  Recommend repeat exam, <10 yrs? No. ASA CLASS:   Class II INDICATIONS:High risk patient with previously diagnosed UC left-sided colitis and rectal bleeding. MEDICATIONS: MAC sedation, administered by CRNA and propofol (Diprivan) 168m IV  DESCRIPTION OF PROCEDURE:   After the risks benefits and alternatives of the procedure were thoroughly explained, informed consent was obtained.  A digital rectal exam revealed no abnormalities of the rectum.   The [employer] Adult Colon [id number] was introduced through the anus and advanced to the cecum, which was identified by both the appendix and ileocecal valve. No adverse events experienced.   The quality of the prep was excellent, using MoviPrep  The instrument was then slowly withdrawn as the colon was fully examined.  COLON FINDINGS: The mucosa appeared normal in the terminal ileum. A normal appearing cecum, ileocecal valve, and appendiceal orifice were identified.  The ascending, hepatic flexure, transverse, splenic flexure, descending, sigmoid colon and rectum appeared unremarkable.  No polyps or cancers were seen.  Retroflexed views revealed internal hemorrhoids. The time to cecum=1 minutes 02 seconds.  Withdrawal time=6 minutes 10 seconds.  The scope was withdrawn and the procedure completed. COMPLICATIONS: There were no complications.  ENDOSCOPIC IMPRESSION: 1.   Normal  mucosa in the terminal ileum 2.   Normal colon . Colitis in remission  RECOMMENDATIONS: 1.  Continue current medications 2.  Call office for follow-up appointment inone year   eSigned:  JEustace Quail MD 02/02/2014 2:24 PM   cc: The Patient

## 2014-02-02 NOTE — Patient Instructions (Signed)
YOU HAD AN ENDOSCOPIC PROCEDURE TODAY AT [facility]: Refer to the procedure report that was given to you for any specific questions about what was found during the examination.  If the procedure report does not answer your questions, please call your gastroenterologist to clarify.  If you requested that your care partner not be given the details of your procedure findings, then the procedure report has been included in a sealed envelope for you to review at your convenience later.  YOU SHOULD EXPECT: Some feelings of bloating in the abdomen. Passage of more gas than usual.  Walking can help get rid of the air that was put into your GI tract during the procedure and reduce the bloating. If you had a lower endoscopy (such as a colonoscopy or flexible sigmoidoscopy) you may notice spotting of blood in your stool or on the toilet paper. If you underwent a bowel prep for your procedure, then you may not have a normal bowel movement for a few days.  DIET: Your first meal following the procedure should be a light meal and then it is ok to progress to your normal diet.  A half-sandwich or bowl of soup is an example of a good first meal.  Heavy or fried foods are harder to digest and may make you feel nauseous or bloated.  Likewise meals heavy in dairy and vegetables can cause extra gas to form and this can also increase the bloating.  Drink plenty of fluids but you should avoid alcoholic beverages for 24 hours.  ACTIVITY: Your care partner should take you home directly after the procedure.  You should plan to take it easy, moving slowly for the rest of the day.  You can resume normal activity the day after the procedure however you should NOT DRIVE or use heavy machinery for 24 hours (because of the sedation medicines used during the test).    SYMPTOMS TO REPORT IMMEDIATELY: A gastroenterologist can be reached at any hour.  During normal business hours, 8:30 AM to 5:00 PM Monday through Friday,  call [phone].  After hours and on weekends, please call the GI answering service at [phone] who will take a message and have the physician on call contact you.   Following lower endoscopy (colonoscopy or flexible sigmoidoscopy):  Excessive amounts of blood in the stool  Significant tenderness or worsening of abdominal pains  Swelling of the abdomen that is new, acute  Fever of 100F or higher FIf any biopsies were taken you will be contacted by phone or by letter within the next 1-3 weeks.  Call your gastroenterologist if you have not heard about the biopsies in 3 weeks.  Our staff will call the home number listed on your records the next business day following your procedure to check on you and address any questions or concerns that you may have at that time regarding the information given to you following your procedure. This is a courtesy call and so if there is no answer at the home number and we have not heard from you through the emergency physician on call, we will assume that you have returned to your regular daily activities without incident.  SIGNATURES/CONFIDENTIALITY: You and/or your care partner have signed paperwork which will be entered into your electronic medical record.  These signatures attest to the fact that that the information above on your After Visit Summary has been reviewed and is understood.  Full responsibility of the confidentiality of this discharge information lies  with you and/or your care-partner.

## 2014-02-02 NOTE — Progress Notes (Signed)
No egg or soy allergy. ewm No problems with past sedation. emw

## 2014-02-05 ENCOUNTER — Telehealth: Payer: Self-pay | Admitting: *Deleted

## 2014-02-05 NOTE — Telephone Encounter (Signed)
  Follow up Call-  Call back number 02/02/2014  Post procedure Call Back phone  # [phone]  Permission to leave phone message Yes     Patient questions:  Left message to call us if needed.

## 2015-02-19 ENCOUNTER — Ambulatory Visit: Payer: 59 | Admitting: Internal Medicine

## 2015-03-13 ENCOUNTER — Other Ambulatory Visit: Payer: Self-pay | Admitting: Internal Medicine

## 2015-07-21 ENCOUNTER — Ambulatory Visit (INDEPENDENT_AMBULATORY_CARE_PROVIDER_SITE_OTHER): Payer: Managed Care, Other (non HMO)

## 2015-07-21 ENCOUNTER — Ambulatory Visit (INDEPENDENT_AMBULATORY_CARE_PROVIDER_SITE_OTHER): Payer: Managed Care, Other (non HMO) | Admitting: Family Medicine

## 2015-07-21 VITALS — BP 106/70 | HR 61 | Temp 98.4°F | Resp 16 | Ht 68.5 in | Wt 175.0 lb

## 2015-07-21 DIAGNOSIS — M25562 Pain in left knee: Secondary | ICD-10-CM | POA: Diagnosis not present

## 2015-07-21 DIAGNOSIS — L237 Allergic contact dermatitis due to plants, except food: Secondary | ICD-10-CM

## 2015-07-21 DIAGNOSIS — S83412A Sprain of medial collateral ligament of left knee, initial encounter: Secondary | ICD-10-CM | POA: Diagnosis not present

## 2015-07-21 DIAGNOSIS — K5190 Ulcerative colitis, unspecified, without complications: Secondary | ICD-10-CM

## 2015-07-21 MED ORDER — PREDNISONE 20 MG PO TABS: ORAL_TABLET | ORAL | Status: DC

## 2015-07-21 MED ORDER — TRAMADOL HCL 50 MG PO TABS: 50.0000 mg | ORAL_TABLET | Freq: Four times a day (QID) | ORAL | Status: DC | PRN

## 2015-07-21 NOTE — Patient Instructions (Signed)
Medial Collateral Knee Ligament Sprain  with Phase I Rehab The medial collateral ligament (MCL) of the knee helps hold the knee joint in proper alignment and prevents the bones from shifting out of alignment (displacing) to the inside (medially). Injury to the knee may cause a tear in the MCL ligament (sprain). Sprains may heal without treatment, but this often results in a loose joint. Sprains are classified into three categories. Grade 1 sprains cause pain, but the tendon is not lengthened. Grade 2 sprains include a lengthened ligament, due to the ligament being stretched or partially ruptured. With grade 2 sprains, there is still function, although possibly decreased. Grade 3 sprains involve a complete tear of the tendon or muscle, and function is usually impaired. SYMPTOMS   Pain and tenderness on the inner side of the knee.  A "pop," tearing or pulling sensation at the time of injury.  Bruising (contusion) at the site of injury, within 48 hours of injury.  Knee stiffness.  Limping, often walking with the knee bent. CAUSES  An MCL sprain occurs when a force is placed on the ligament that is greater than it can handle. Common mechanisms of injury include:  Direct hit (trauma) to the outer side of the knee, especially if the foot is planted on the ground.  Forceful pivoting of the body and leg, while the foot is planted on the ground. RISK INCREASES WITH:  Contact sports (football, rugby).  Sports that require pivoting or cutting (soccer).  Poor knee strength and flexibility.  Improper equipment use. PREVENTION  Warm up and stretch properly before activity.  Maintain physical fitness:  Strength, flexibility and endurance.  Cardiovascular fitness.  Wear properly fitted protective equipment (correct length of cleats for surface).  Functional braces may be effective in preventing injury. PROGNOSIS  MCL tears usually heal without the need for surgery. Sometimes however,  surgery is required. RELATED COMPLICATIONS  Frequently recurring symptoms, such as the knee giving way, knee instability or knee swelling.  Injury to other structures in the knee joint:  Meniscal cartilage, resulting in locking and swelling of the knee.  Articular cartilage, resulting in knee arthritis.  Other ligaments of the knee.  Injury to nerves, resulting in numbness of the outer leg, foot or ankle and weakness or paralysis, with inability to raise the ankle or toes.  Knee stiffness. TREATMENT Treatment first involves the use of ice and medicine, to reduce pain and inflammation. The use of strengthening and stretching exercises may help reduce pain with activity. These exercises may be performed at home, but referral to a therapist is often advised. You may be advised to walk with crutches until you are able to walk without a limp. Your caregiver may provide you with a hinged knee brace to help regain a full range of motion, while also protecting the injured knee. For severe MCL injuries or injuries that involve other ligaments of the knee, surgery is often advised. MEDICATION  Do not take pain medicine for 7 days before surgery.  Only use over-the-counter pain medicine as directed by your caregiver.  Only use prescription pain relievers as directed and only in needed amounts. HEAT AND COLD  Cold treatment (icing) should be applied for 10 to 15 minutes every 2 to 3 hours for inflammation and pain, and immediately after any activity, that aggravates the symptoms. Use ice packs or an ice massage.  Heat treatment may be used before performing stretching and strengthening activities prescribed by your caregiver, physical therapist or athletic trainer.  Use a heat pack or warm water soak. SEEK MEDICAL CARE IF:   Symptoms get worse or do not improve in 4 to 6 weeks, despite treatment.  New, unexplained symptoms develop. EXERCISES  PHASE I EXERCISES  RANGE OF MOTION (ROM) AND  STRETCHING EXERCISES-Medial Collateral Knee Ligament Sprain Phase I These are some of the initial exercises that your physician, physical therapist or athletic trainer may have you perform to begin your rehabilitation. When you demonstrate gains in your flexibility and strength, your caregiver may progress you to Phase II exercises. As you perform these exercises, remember:  These initial exercises are intended to be gentle. They will help you restore motion without increasing any swelling.  Completing these exercises allows less painful movement and prepares you for the more aggressive strengthening exercises in Phase II.  An effective stretch should be held for at least 30 seconds.  A stretch should never be painful. You should only feel a gentle lengthening or release in the stretched tissue. RANGE OF MOTION-Knee Flexion, Active  Lie on your back with both knees straight. (If this causes back discomfort, bend your healthy knee, placing your foot flat on the floor.)  Slowly slide your heel back toward your buttocks until you feel a gentle stretch in the front of your knee or thigh.  Hold for __________ seconds. Slowly slide your heel back to the starting position. Repeat __________ times. Complete this exercise __________ times per day. STRETCH-Knee Flexion, Supine  Lie on the floor with your right / left heel and foot lightly touching the wall. (Place both feet on the wall if you do not use a door frame.)  Without using any effort, allow gravity to slide your foot down the wall slowly until you feel a gentle stretch in the front of your right / left knee.  Hold this stretch for __________ seconds. Then return the leg to the starting position, using your health leg for help, if needed. Repeat __________ times. Complete this stretch __________ times per day. RANGE OF MOTION-Knee Flexion and Extension, Active-Assisted  Sit on the edge of a table or chair with your thighs firmly supported.  It may be helpful to place a folded towel under the end of your right / left thigh.  Flexion (bending): Place the ankle of your healthy leg on top of the other ankle. Use your healthy leg to gently bend your right / left knee until you feel a mild tension across the top of your knee.  Hold for __________ seconds.  Extension (straightening): Switch your ankles so your right / left leg is on top. Use your healthy leg to straighten your right / left knee until you feel a mild tension on the backside of your knee.  Hold for __________ seconds. Repeat __________ times. Complete this exercise __________ times per day. STRETCH-Knee Extension Sitting  Sit with your right / left leg/heel propped on another chair, coffee table, or foot stool.  Allow your leg muscles to relax, letting gravity straighten out your knee.*  You should feel a stretch behind your right / left knee. Hold this position for __________ seconds. Repeat __________ times. Complete this stretch __________ times per day. *Your physician, physical therapist or athletic trainer may instruct you to place a __________ weight on your thigh, just above your kneecap, to deepen the stretch. STRENGTHENING EXERCISES-Medial Collateral Knee Ligament Sprain Phase I These exercises may help you when beginning to rehabilitate your injury. They may resolve your symptoms with or without further involvement  from your physician, physical therapist or athletic trainer. While completing these exercises, remember:   In order to return to more demanding activities, you will likely need to progress to more challenging exercises. Your physician, physical therapist or athletic trainer will advance your exercises when your tissues show adequate healing and your muscles demonstrate increased strength.  Muscles can gain both the endurance and the strength needed for everyday activities through controlled exercises.  Complete these exercises as instructed by  your physician, physical therapist or athletic trainer. Increase the resistance and repetitions only as guided by your caregiver. STRENGTH-Quadriceps, Isometrics  Lie on your back with your right / left leg extended and your opposite knee bent.  Gradually tense the muscles in the front of your right / left thigh. You should see either your kneecap slide up toward your hip or an increased dimpling just above the knee. This motion will push the back of the knee down toward the floor, mat or bed on which you are lying.  Hold the muscle as tight as you can without increasing your pain for __________ seconds.  Relax the muscles slowly and completely in between each repetition. Repeat __________ times. Complete this exercise __________ times per day. STRENGTH-Quadriceps, Short Arcs  Lie on your back. Place a __________ inch towel roll under your knee so that the knee slightly bends.  Raise only your lower leg by tightening the muscles in the front of your thigh. Do not allow your thigh to rise.  Hold this position for __________ seconds. Repeat __________ times. Complete this exercise __________ times per day. OPTIONAL ANKLE WEIGHTS: Begin with ____________________, but DO NOT exceed ____________________. Increase in 1 pound/0.5 kilogram increments.  STRENGTH--Quadriceps, Straight Leg Raises Quality counts! Watch for signs that the quadriceps muscle is working, to be sure you are strengthening the correct muscles and not "cheating" by substituting with healthier muscles.  Lay on your back with your right / left leg extended and your opposite knee bent.  Tense the muscles in the front of your right / left thigh. You should see either your knee cap slide up or increased dimpling just above the knee. Your thigh may even shake a bit.  Tighten these muscles even more and raise your leg 4 to 6 inches off the floor. Hold for __________ seconds.  Keeping these muscles tense, lower your leg.  Relax  the muscles slowly and completely in between each repetition. Repeat __________ times. Complete this exercise __________ times per day. STRENGTH-Hamstring, Isometrics  Lie on your back on a firm surface.  Bend your right / left knee approximately __________ degrees.  Dig your heel into the surface as if you are trying to pull it toward your buttocks. Tighten the muscles in the back of your thighs to "dig" as hard as you can, without increasing any pain.  Hold this position for __________ seconds.  Release the tension gradually and allow your muscle to completely relax for __________ seconds in between each exercise. Repeat __________ times. Complete this exercise __________ times per day. STRENGTH-Hamstring, Curls  Lay on your stomach with your legs extended. (If you lay on a bed, your feet may hang over the edge.)  Tighten the muscles in the back of your thigh to bend your right / left knee up to 90 degrees. Keep your hips flat on the bed.  Hold this position for __________ seconds.  Slowly lower your leg back to the starting position. Repeat __________ times. Complete this exercise __________ times per day.  OPTIONAL ANKLE WEIGHTS: Begin with ____________________, but DO NOT exceed ____________________. Increase in 1 pound/0.5 kilogram increments.  Document Released: 12/07/2005 Document Revised: 02/29/2012 Document Reviewed: 03/21/2009 [employer] Patient Information 2015 [place], [place]. This information is not intended to replace advice given to you by your health care provider. Make sure you discuss any questions you have with your health care provider.

## 2015-07-21 NOTE — Progress Notes (Addendum)
Subjective:    Patient ID: George Morrison, male    DOB: 1981/04/23, 34 y.o.   MRN: [id number] This chart was scribed for [name], MD by Zola Button, Medical Scribe. This patient was seen in Room 1 and the patient's care was started at 10:06 AM.   07/21/2015  Knee Pain and Poison Ivy   HPI  HPI Comments: George Morrison is a 34 y.o. male with a history of ulcerative colitis who presents to the [facility] complaining of sudden onset left medial knee pain with some swelling that started yesterday after an injury while playing soccer. Patient was hit in the medial aspect of his left knee and did hear a pop. He has felt as if it may give out at times, but it has not given out. He has applied ice, but he has not taken any medications for the pain. Patient plays soccer every week. He has never injured his left knee before, but he has injured his right knee.  Patient also complains of a rash that started in his right arm about a week and a half ago after exposure to poison ivy, but has since spread throughout his body, including his abdomen and legs. He has tried calamine and other topical creams for this.   Patient has been doing well with his ulcerative colitis and has been compliant with his medications. He currently sees Dr. Henrene Pastor once a year.  Patient works at [employer].  Review of Systems  Constitutional: Negative for fever, chills, diaphoresis and fatigue.  Musculoskeletal: Positive for joint swelling, arthralgias and gait problem. Negative for myalgias and back pain.  Skin: Positive for rash.  Neurological: Negative for weakness and numbness.    Past Medical History  Diagnosis Date  . Anal fissure   . Ulcerative colitis    Past Surgical History  Procedure Laterality Date  . Ganglion cyst excision      left hand   Allergies  Allergen Reactions  . Codeine Rash    dizzy     History   Social History  . Marital Status: Married    Spouse Name: N/A   . Number of Children: N/A  . Years of Education: N/A   Occupational History  . Not on file.   Social History Main Topics  . Smoking status: Never Smoker   . Smokeless tobacco: Never Used  . Alcohol Use: Yes     Comment: very occasional  . Drug Use: No  . Sexual Activity: Yes    Birth Control/ Protection: Condom   Other Topics Concern  . Not on file   Social History Narrative   Unemployed-Student   No children   Caffienated beverages-No   Seat Belt Use often-Yes   Regular exercise-Yes   Smoke alarm in home-Yes   Guns or firearms in home-No   History of physical abuse-No             Objective:    BP 106/70 mmHg  Pulse 61  Temp(Src) 98.4 F (36.9 C) (Oral)  Resp 16  Ht 5' 8.5" (1.74 m)  Wt 175 lb (79.379 kg)  BMI 26.22 kg/m2  SpO2 98% Physical Exam  Constitutional: He is oriented to person, place, and time. He appears well-developed and well-nourished. No distress.  HENT:  Head: Normocephalic and atraumatic.  Mouth/Throat: No oropharyngeal exudate.  Eyes: Pupils are equal, round, and reactive to light.  Neck: Neck supple.  Pulmonary/Chest: Effort normal.  Clear to auscultation bilaterally.  Musculoskeletal: He exhibits tenderness. He exhibits no edema.  Tenderness to medial joint line of left knee. No patellar tenderness. McMurray positive. Full flexion and extension. Anterior drawer, posterior drawer, and Lachman's negative. Varus and valgus intact. No swelling. No thigh or calf tenderness.  Neurological: He is alert and oriented to person, place, and time. No cranial nerve deficit.  Skin: Skin is warm and dry. Rash noted.  Healing maculopapular rash to bilateral forearms and posterior elbows. No active vesicles or pustules.  Psychiatric: He has a normal mood and affect. His behavior is normal.  Vitals reviewed.   [facility] reading (PRIMARY) by  Dr. Tamala Julian.  L KNEE FILMS: NAD      Assessment & Plan:   1. Left medial knee pain   2. Poison ivy dermatitis     3. Sprain and strain of medial collateral ligament of knee, left, initial encounter   4. Ulcerative colitis, without complications     1. L medial knee pain/strain: New. Rx for Prednisone and Tramadol provided. Ice knee twice daily. Home exercise program provided to perform daily. If no improvement in 7-14 days, call for ortho referral.  No exercise for one week. 2.  Poison ivy dermatitis: New. Rx for Prednisone provided. 3. Ulcerative colitis: stable; well controlled on Morrison regimen.   Meds ordered this encounter  Medications  . predniSONE (DELTASONE) 20 MG tablet    Sig: Two tablets daily x 5 days then one tablet daily x 5 days    Dispense:  15 tablet    Refill:  0  . traMADol (ULTRAM) 50 MG tablet    Sig: Take 1 tablet (50 mg total) by mouth every 6 (six) hours as needed.    Dispense:  30 tablet    Refill:  0    No Follow-up on file.   I personally performed the services described in this documentation, which was scribed in my presence. The recorded information has been reviewed and considered.  Loriene Taunton Elayne Guerin, M.D. [facility] [address] [place], [place]  [address] [phone] phone [phone] fax

## 2015-08-17 ENCOUNTER — Encounter (HOSPITAL_COMMUNITY): Payer: Self-pay | Admitting: *Deleted

## 2015-08-17 ENCOUNTER — Observation Stay (HOSPITAL_COMMUNITY)
Admission: EM | Admit: 2015-08-17 | Discharge: 2015-08-18 | Disposition: A | Discharge status: Home / Self Care | Payer: Managed Care, Other (non HMO) | Attending: Surgery | Admitting: Surgery

## 2015-08-17 ENCOUNTER — Emergency Department (HOSPITAL_COMMUNITY): Payer: Managed Care, Other (non HMO) | Admitting: Certified Registered"

## 2015-08-17 ENCOUNTER — Ambulatory Visit (INDEPENDENT_AMBULATORY_CARE_PROVIDER_SITE_OTHER): Payer: Managed Care, Other (non HMO) | Admitting: Family Medicine

## 2015-08-17 ENCOUNTER — Emergency Department (HOSPITAL_COMMUNITY): Payer: Managed Care, Other (non HMO)

## 2015-08-17 ENCOUNTER — Encounter (HOSPITAL_COMMUNITY)
Admission: EM | Disposition: A | Discharge status: Home / Self Care | Payer: Self-pay | Source: Home / Self Care | Attending: Emergency Medicine

## 2015-08-17 VITALS — BP 128/86 | HR 74 | Temp 97.3°F | Resp 18 | Ht 68.0 in | Wt 180.0 lb

## 2015-08-17 DIAGNOSIS — Z23 Encounter for immunization: Secondary | ICD-10-CM | POA: Insufficient documentation

## 2015-08-17 DIAGNOSIS — R100 Acute abdomen: Secondary | ICD-10-CM

## 2015-08-17 DIAGNOSIS — K5190 Ulcerative colitis, unspecified, without complications: Secondary | ICD-10-CM | POA: Diagnosis not present

## 2015-08-17 DIAGNOSIS — K5150 Left sided colitis without complications: Secondary | ICD-10-CM | POA: Diagnosis present

## 2015-08-17 DIAGNOSIS — K3533 Acute appendicitis with perforation and localized peritonitis, with abscess: Secondary | ICD-10-CM | POA: Insufficient documentation

## 2015-08-17 DIAGNOSIS — K353 Acute appendicitis with localized peritonitis: Secondary | ICD-10-CM | POA: Diagnosis not present

## 2015-08-17 DIAGNOSIS — K51911 Ulcerative colitis, unspecified with rectal bleeding: Secondary | ICD-10-CM

## 2015-08-17 DIAGNOSIS — Z79899 Other long term (current) drug therapy: Secondary | ICD-10-CM | POA: Insufficient documentation

## 2015-08-17 DIAGNOSIS — K3580 Unspecified acute appendicitis: Secondary | ICD-10-CM

## 2015-08-17 DIAGNOSIS — R109 Unspecified abdominal pain: Secondary | ICD-10-CM | POA: Diagnosis present

## 2015-08-17 HISTORY — PX: LAPAROSCOPIC APPENDECTOMY: SHX408

## 2015-08-17 LAB — COMPREHENSIVE METABOLIC PANEL
ALBUMIN: 4.4 g/dL (ref 3.5–5.0)
ALK PHOS: 66 U/L (ref 38–126)
ALT: 26 U/L (ref 17–63)
AST: 22 U/L (ref 15–41)
Anion gap: 6 (ref 5–15)
BILIRUBIN TOTAL: 1.1 mg/dL (ref 0.3–1.2)
BUN: 13 mg/dL (ref 6–20)
CALCIUM: 8.8 mg/dL — AB (ref 8.9–10.3)
CO2: 24 mmol/L (ref 22–32)
CREATININE: 0.82 mg/dL (ref 0.61–1.24)
Chloride: 107 mmol/L (ref 101–111)
GFR calc Af Amer: >60 mL/min (ref >60)
GLUCOSE: 114 mg/dL — AB (ref 65–99)
Potassium: 3.3 mmol/L — ABNORMAL LOW (ref 3.5–5.1)
Sodium: 137 mmol/L (ref 135–145)
TOTAL PROTEIN: 7.6 g/dL (ref 6.5–8.1)

## 2015-08-17 LAB — URINALYSIS, ROUTINE W REFLEX MICROSCOPIC
BILIRUBIN URINE: NEGATIVE
GLUCOSE, UA: NEGATIVE mg/dL
HGB URINE DIPSTICK: NEGATIVE
KETONES UR: NEGATIVE mg/dL
Leukocytes, UA: NEGATIVE
NITRITE: NEGATIVE
PH: 8.5 — AB (ref 5.0–8.0)
Protein, ur: NEGATIVE mg/dL
SPECIFIC GRAVITY, URINE: 1.02 (ref 1.005–1.030)
Urobilinogen, UA: 0.2 mg/dL (ref 0.0–1.0)

## 2015-08-17 LAB — CBC WITH DIFFERENTIAL/PLATELET
BASOS ABS: 0 10*3/uL (ref 0.0–0.1)
BASOS PCT: 0 % (ref 0–1)
Eosinophils Absolute: 0 10*3/uL (ref 0.0–0.7)
Eosinophils Relative: 0 % (ref 0–5)
HEMATOCRIT: 42.9 % (ref 39.0–52.0)
HEMOGLOBIN: 14.6 g/dL (ref 13.0–17.0)
LYMPHS PCT: 14 % (ref 12–46)
Lymphs Abs: 1.9 10*3/uL (ref 0.7–4.0)
MCH: 30.8 pg (ref 26.0–34.0)
MCHC: 34 g/dL (ref 30.0–36.0)
MCV: 90.5 fL (ref 78.0–100.0)
MONO ABS: 1 10*3/uL (ref 0.1–1.0)
Monocytes Relative: 7 % (ref 3–12)
NEUTROS ABS: 10.8 10*3/uL — AB (ref 1.7–7.7)
NEUTROS PCT: 79 % — AB (ref 43–77)
Platelets: 299 10*3/uL (ref 150–400)
RBC: 4.74 MIL/uL (ref 4.22–5.81)
RDW: 13.3 % (ref 11.5–15.5)
WBC: 13.7 10*3/uL — ABNORMAL HIGH (ref 4.0–10.5)

## 2015-08-17 LAB — POCT CBC
Granulocyte percent: 74.3 %G (ref 37–80)
HCT, POC: 47.6 % (ref 43.5–53.7)
Hemoglobin: 15.3 g/dL (ref 14.1–18.1)
Lymph, poc: 3.2 (ref 0.6–3.4)
MCH, POC: 29 pg (ref 27–31.2)
MCHC: 32.1 g/dL (ref 31.8–35.4)
MCV: 90.4 fL (ref 80–97)
MID (cbc): 0.6 (ref 0–0.9)
MPV: 7.1 fL (ref 0–99.8)
POC Granulocyte: 11.1 — AB (ref 2–6.9)
POC LYMPH PERCENT: 21.5 %L (ref 10–50)
POC MID %: 4.2 %M (ref 0–12)
Platelet Count, POC: 319 10*3/uL (ref 142–424)
RBC: 5.26 M/uL (ref 4.69–6.13)
RDW, POC: 13.5 %
WBC: 15 10*3/uL — AB (ref 4.6–10.2)

## 2015-08-17 LAB — POCT SEDIMENTATION RATE: POCT SED RATE: 13 mm/hr (ref 0–22)

## 2015-08-17 LAB — LIPASE, BLOOD: LIPASE: 18 U/L — AB (ref 22–51)

## 2015-08-17 SURGERY — APPENDECTOMY, LAPAROSCOPIC
Anesthesia: General | Site: Abdomen

## 2015-08-17 MED ORDER — LACTATED RINGERS IV SOLN
INTRAVENOUS | Status: DC | PRN
  Administered 2015-08-17: 17:00:00 via INTRAVENOUS

## 2015-08-17 MED ORDER — TRAMADOL HCL 50 MG PO TABS: 50.0000 mg | ORAL_TABLET | Freq: Four times a day (QID) | ORAL | Status: DC | PRN

## 2015-08-17 MED ORDER — SODIUM CHLORIDE 0.9 % IV SOLN: Freq: Once | INTRAVENOUS | Status: DC

## 2015-08-17 MED ORDER — IOHEXOL 300 MG/ML  SOLN
25.0000 mL | Freq: Once | INTRAMUSCULAR | Status: AC | PRN
  Administered 2015-08-17: 25 mL via ORAL

## 2015-08-17 MED ORDER — SODIUM CHLORIDE 0.9 % IV SOLN
8.0000 mg | Freq: Four times a day (QID) | INTRAVENOUS | Status: DC | PRN
  Filled 2015-08-17: qty 4

## 2015-08-17 MED ORDER — LACTATED RINGERS IV SOLN: INTRAVENOUS | Status: DC

## 2015-08-17 MED ORDER — KETOROLAC TROMETHAMINE 15 MG/ML IJ SOLN: 15.0000 mg | Freq: Four times a day (QID) | INTRAMUSCULAR | Status: DC | PRN

## 2015-08-17 MED ORDER — NEOSTIGMINE METHYLSULFATE 10 MG/10ML IV SOLN
INTRAVENOUS | Status: DC | PRN
  Administered 2015-08-17: 5 mg via INTRAVENOUS

## 2015-08-17 MED ORDER — HYDROMORPHONE HCL 1 MG/ML IJ SOLN
0.5000 mg | INTRAMUSCULAR | Status: DC | PRN
  Administered 2015-08-17: 1 mg via INTRAVENOUS
  Filled 2015-08-17: qty 1

## 2015-08-17 MED ORDER — BUPIVACAINE-EPINEPHRINE 0.25% -1:200000 IJ SOLN
INTRAMUSCULAR | Status: AC
  Filled 2015-08-17: qty 1

## 2015-08-17 MED ORDER — LACTATED RINGERS IV BOLUS (SEPSIS)
1000.0000 mL | Freq: Once | INTRAVENOUS | Status: AC
  Administered 2015-08-17: 1000 mL via INTRAVENOUS

## 2015-08-17 MED ORDER — LIP MEDEX EX OINT
1.0000 "application " | TOPICAL_OINTMENT | Freq: Two times a day (BID) | CUTANEOUS | Status: DC
  Filled 2015-08-17: qty 7

## 2015-08-17 MED ORDER — CHLORHEXIDINE GLUCONATE 4 % EX LIQD: 1.0000 "application " | Freq: Once | CUTANEOUS | Status: DC

## 2015-08-17 MED ORDER — ROCURONIUM BROMIDE 100 MG/10ML IV SOLN
INTRAVENOUS | Status: DC | PRN
  Administered 2015-08-17: 5 mg via INTRAVENOUS
  Administered 2015-08-17: 35 mg via INTRAVENOUS

## 2015-08-17 MED ORDER — DEXAMETHASONE SODIUM PHOSPHATE 10 MG/ML IJ SOLN
INTRAMUSCULAR | Status: AC
  Filled 2015-08-17: qty 1

## 2015-08-17 MED ORDER — MEPERIDINE HCL 50 MG/ML IJ SOLN: 6.2500 mg | INTRAMUSCULAR | Status: DC | PRN

## 2015-08-17 MED ORDER — DEXTROSE 5 % IV SOLN
2.0000 g | INTRAVENOUS | Status: DC
  Administered 2015-08-17: 2 g via INTRAVENOUS
  Filled 2015-08-17 (×2): qty 2

## 2015-08-17 MED ORDER — FENTANYL CITRATE (PF) 100 MCG/2ML IJ SOLN
50.0000 ug | Freq: Once | INTRAMUSCULAR | Status: AC
  Administered 2015-08-17: 50 ug via INTRAVENOUS
  Filled 2015-08-17: qty 2

## 2015-08-17 MED ORDER — SODIUM CHLORIDE 0.9 % IV SOLN: 250.0000 mL | INTRAVENOUS | Status: DC | PRN

## 2015-08-17 MED ORDER — ENOXAPARIN SODIUM 40 MG/0.4ML ~~LOC~~ SOLN
40.0000 mg | SUBCUTANEOUS | Status: DC
  Administered 2015-08-18: 40 mg via SUBCUTANEOUS
  Filled 2015-08-17 (×2): qty 0.4

## 2015-08-17 MED ORDER — ONDANSETRON HCL 4 MG/2ML IJ SOLN: 4.0000 mg | Freq: Four times a day (QID) | INTRAMUSCULAR | Status: DC | PRN

## 2015-08-17 MED ORDER — DIPHENHYDRAMINE HCL 50 MG/ML IJ SOLN: 12.5000 mg | Freq: Four times a day (QID) | INTRAMUSCULAR | Status: DC | PRN

## 2015-08-17 MED ORDER — SIMETHICONE 80 MG PO CHEW
160.0000 mg | CHEWABLE_TABLET | Freq: Four times a day (QID) | ORAL | Status: DC | PRN
  Filled 2015-08-17: qty 2

## 2015-08-17 MED ORDER — MIDAZOLAM HCL 5 MG/5ML IJ SOLN
INTRAMUSCULAR | Status: DC | PRN
  Administered 2015-08-17: 2 mg via INTRAVENOUS

## 2015-08-17 MED ORDER — PROMETHAZINE HCL 25 MG/ML IJ SOLN
6.2500 mg | INTRAMUSCULAR | Status: DC | PRN
  Filled 2015-08-17: qty 1

## 2015-08-17 MED ORDER — FENTANYL CITRATE (PF) 250 MCG/5ML IJ SOLN
INTRAMUSCULAR | Status: AC
  Filled 2015-08-17: qty 25

## 2015-08-17 MED ORDER — ONDANSETRON HCL 4 MG/2ML IJ SOLN
INTRAMUSCULAR | Status: AC
  Filled 2015-08-17: qty 2

## 2015-08-17 MED ORDER — PROPOFOL 10 MG/ML IV BOLUS
INTRAVENOUS | Status: AC
  Filled 2015-08-17: qty 20

## 2015-08-17 MED ORDER — ACETAMINOPHEN 500 MG PO TABS
1000.0000 mg | ORAL_TABLET | Freq: Three times a day (TID) | ORAL | Status: DC
  Administered 2015-08-17: 1000 mg via ORAL
  Filled 2015-08-17 (×4): qty 2

## 2015-08-17 MED ORDER — MENTHOL 3 MG MT LOZG
1.0000 | LOZENGE | OROMUCOSAL | Status: DC | PRN
  Filled 2015-08-17: qty 9

## 2015-08-17 MED ORDER — LACTATED RINGERS IR SOLN
Status: DC | PRN
  Administered 2015-08-17: 1

## 2015-08-17 MED ORDER — AMOXICILLIN-POT CLAVULANATE 875-125 MG PO TABS: 1.0000 | ORAL_TABLET | Freq: Two times a day (BID) | ORAL | Status: DC

## 2015-08-17 MED ORDER — PROPOFOL 10 MG/ML IV BOLUS
INTRAVENOUS | Status: DC | PRN
  Administered 2015-08-17: 200 mg via INTRAVENOUS

## 2015-08-17 MED ORDER — SODIUM CHLORIDE 0.9 % IJ SOLN: 3.0000 mL | Freq: Two times a day (BID) | INTRAMUSCULAR | Status: DC

## 2015-08-17 MED ORDER — SODIUM CHLORIDE 0.9 % IV BOLUS (SEPSIS)
1000.0000 mL | Freq: Once | INTRAVENOUS | Status: AC
  Administered 2015-08-17: 1000 mL via INTRAVENOUS

## 2015-08-17 MED ORDER — GLYCOPYRROLATE 0.2 MG/ML IJ SOLN
INTRAMUSCULAR | Status: AC
  Filled 2015-08-17: qty 4

## 2015-08-17 MED ORDER — LIDOCAINE HCL (CARDIAC) 20 MG/ML IV SOLN
INTRAVENOUS | Status: DC | PRN
  Administered 2015-08-17: 50 mg via INTRAVENOUS

## 2015-08-17 MED ORDER — GLYCOPYRROLATE 0.2 MG/ML IJ SOLN
INTRAMUSCULAR | Status: DC | PRN
  Administered 2015-08-17: .8 mg via INTRAVENOUS

## 2015-08-17 MED ORDER — SODIUM CHLORIDE 0.9 % IJ SOLN: 3.0000 mL | INTRAMUSCULAR | Status: DC | PRN

## 2015-08-17 MED ORDER — KETOROLAC TROMETHAMINE 30 MG/ML IJ SOLN
INTRAMUSCULAR | Status: AC
  Filled 2015-08-17: qty 1

## 2015-08-17 MED ORDER — MESALAMINE 1.2 G PO TBEC
2.4000 g | DELAYED_RELEASE_TABLET | Freq: Two times a day (BID) | ORAL | Status: DC
  Administered 2015-08-17 – 2015-08-18 (×2): 2.4 g via ORAL
  Filled 2015-08-17 (×3): qty 2

## 2015-08-17 MED ORDER — ONDANSETRON HCL 4 MG/2ML IJ SOLN
INTRAMUSCULAR | Status: DC | PRN
  Administered 2015-08-17: 4 mg via INTRAVENOUS

## 2015-08-17 MED ORDER — LACTATED RINGERS IV BOLUS (SEPSIS): 1000.0000 mL | Freq: Three times a day (TID) | INTRAVENOUS | Status: DC | PRN

## 2015-08-17 MED ORDER — SUCCINYLCHOLINE CHLORIDE 20 MG/ML IJ SOLN
INTRAMUSCULAR | Status: DC | PRN
  Administered 2015-08-17: 100 mg via INTRAVENOUS

## 2015-08-17 MED ORDER — INFLUENZA VAC SPLIT QUAD 0.5 ML IM SUSY
0.5000 mL | PREFILLED_SYRINGE | INTRAMUSCULAR | Status: AC
  Administered 2015-08-18: 0.5 mL via INTRAMUSCULAR
  Filled 2015-08-17 (×2): qty 0.5

## 2015-08-17 MED ORDER — MIDAZOLAM HCL 2 MG/2ML IJ SOLN
INTRAMUSCULAR | Status: AC
  Filled 2015-08-17: qty 4

## 2015-08-17 MED ORDER — HYDROMORPHONE HCL 1 MG/ML IJ SOLN: 0.2500 mg | INTRAMUSCULAR | Status: DC | PRN

## 2015-08-17 MED ORDER — LIDOCAINE HCL (CARDIAC) 20 MG/ML IV SOLN
INTRAVENOUS | Status: AC
  Filled 2015-08-17: qty 5

## 2015-08-17 MED ORDER — METRONIDAZOLE IN NACL 5-0.79 MG/ML-% IV SOLN: 500.0000 mg | Freq: Four times a day (QID) | INTRAVENOUS | Status: DC

## 2015-08-17 MED ORDER — ROCURONIUM BROMIDE 100 MG/10ML IV SOLN
INTRAVENOUS | Status: AC
  Filled 2015-08-17: qty 1

## 2015-08-17 MED ORDER — PROMETHAZINE HCL 25 MG/ML IJ SOLN: 6.2500 mg | INTRAMUSCULAR | Status: DC | PRN

## 2015-08-17 MED ORDER — MAGIC MOUTHWASH
15.0000 mL | Freq: Four times a day (QID) | ORAL | Status: DC | PRN
  Filled 2015-08-17: qty 15

## 2015-08-17 MED ORDER — PHENOL 1.4 % MT LIQD
2.0000 | OROMUCOSAL | Status: DC | PRN
  Filled 2015-08-17: qty 177

## 2015-08-17 MED ORDER — ACETAMINOPHEN 650 MG RE SUPP: 650.0000 mg | Freq: Four times a day (QID) | RECTAL | Status: DC | PRN

## 2015-08-17 MED ORDER — ALUM & MAG HYDROXIDE-SIMETH 200-200-20 MG/5ML PO SUSP: 30.0000 mL | Freq: Four times a day (QID) | ORAL | Status: DC | PRN

## 2015-08-17 MED ORDER — 0.9 % SODIUM CHLORIDE (POUR BTL) OPTIME
TOPICAL | Status: DC | PRN
  Administered 2015-08-17: 1000 mL

## 2015-08-17 MED ORDER — BUPIVACAINE-EPINEPHRINE 0.25% -1:200000 IJ SOLN
INTRAMUSCULAR | Status: DC | PRN
  Administered 2015-08-17: 80 mL

## 2015-08-17 MED ORDER — IOHEXOL 300 MG/ML  SOLN
100.0000 mL | Freq: Once | INTRAMUSCULAR | Status: AC | PRN
  Administered 2015-08-17: 100 mL via INTRAVENOUS

## 2015-08-17 MED ORDER — ACETAMINOPHEN 325 MG PO TABS: 325.0000 mg | ORAL_TABLET | Freq: Four times a day (QID) | ORAL | Status: DC | PRN

## 2015-08-17 MED ORDER — BISACODYL 10 MG RE SUPP: 10.0000 mg | Freq: Two times a day (BID) | RECTAL | Status: DC | PRN

## 2015-08-17 MED ORDER — KETOROLAC TROMETHAMINE 30 MG/ML IJ SOLN
INTRAMUSCULAR | Status: DC | PRN
  Administered 2015-08-17: 30 mg via INTRAVENOUS

## 2015-08-17 MED ORDER — DEXAMETHASONE SODIUM PHOSPHATE 10 MG/ML IJ SOLN
INTRAMUSCULAR | Status: DC | PRN
  Administered 2015-08-17: 10 mg via INTRAVENOUS

## 2015-08-17 MED ORDER — FENTANYL CITRATE (PF) 100 MCG/2ML IJ SOLN
INTRAMUSCULAR | Status: DC | PRN
  Administered 2015-08-17 (×3): 50 ug via INTRAVENOUS
  Administered 2015-08-17: 100 ug via INTRAVENOUS

## 2015-08-17 MED ORDER — METRONIDAZOLE IN NACL 5-0.79 MG/ML-% IV SOLN
500.0000 mg | Freq: Three times a day (TID) | INTRAVENOUS | Status: DC
  Administered 2015-08-17 – 2015-08-18 (×3): 500 mg via INTRAVENOUS
  Filled 2015-08-17 (×4): qty 100

## 2015-08-17 MED ORDER — LACTATED RINGERS IV BOLUS (SEPSIS)
1000.0000 mL | Freq: Three times a day (TID) | INTRAVENOUS | Status: DC | PRN
Start: 2015-08-17 — End: 2015-08-18

## 2015-08-17 MED ORDER — NEOSTIGMINE METHYLSULFATE 10 MG/10ML IV SOLN
INTRAVENOUS | Status: AC
  Filled 2015-08-17: qty 1

## 2015-08-17 MED ORDER — BUPIVACAINE-EPINEPHRINE (PF) 0.25% -1:200000 IJ SOLN
INTRAMUSCULAR | Status: AC
  Filled 2015-08-17: qty 30

## 2015-08-17 SURGICAL SUPPLY — 43 items
APPLIER CLIP 5 13 M/L LIGAMAX5 (MISCELLANEOUS)
APPLIER CLIP ROT 10 11.4 M/L (STAPLE)
APR CLP MED LRG 11.4X10 (STAPLE)
APR CLP MED LRG 5 ANG JAW (MISCELLANEOUS)
BAG SPEC RTRVL LRG 6X4 10 (ENDOMECHANICALS) ×1
CLIP APPLIE 5 13 M/L LIGAMAX5 (MISCELLANEOUS) IMPLANT
CLIP APPLIE ROT 10 11.4 M/L (STAPLE) IMPLANT
COVER SURGICAL LIGHT HANDLE (MISCELLANEOUS) ×1 IMPLANT
CUTTER FLEX LINEAR 45M (STAPLE) ×2 IMPLANT
DECANTER SPIKE VIAL GLASS SM (MISCELLANEOUS) ×3 IMPLANT
DEVICE TROCAR PUNCTURE CLOSURE (ENDOMECHANICALS) IMPLANT
DRAPE LAPAROSCOPIC ABDOMINAL (DRAPES) ×3 IMPLANT
DRAPE WARM FLUID 44X44 (DRAPE) ×3 IMPLANT
DRSG TEGADERM 2-3/8X2-3/4 SM (GAUZE/BANDAGES/DRESSINGS) ×4 IMPLANT
DRSG TEGADERM 4X4.75 (GAUZE/BANDAGES/DRESSINGS) ×3 IMPLANT
ELECT REM PT RETURN 9FT ADLT (ELECTROSURGICAL) ×3
ELECTRODE REM PT RTRN 9FT ADLT (ELECTROSURGICAL) ×1 IMPLANT
ENDOLOOP SUT PDS II  0 18 (SUTURE)
ENDOLOOP SUT PDS II 0 18 (SUTURE) IMPLANT
GAUZE SPONGE 2X2 8PLY STRL LF (GAUZE/BANDAGES/DRESSINGS) ×1 IMPLANT
GLOVE ECLIPSE 8.0 STRL XLNG CF (GLOVE) ×3 IMPLANT
GLOVE INDICATOR 8.0 STRL GRN (GLOVE) ×3 IMPLANT
GOWN STRL REUS W/TWL XL LVL3 (GOWN DISPOSABLE) ×9 IMPLANT
KIT BASIN OR (CUSTOM PROCEDURE TRAY) ×3 IMPLANT
PEN SKIN MARKING BROAD (MISCELLANEOUS) ×3 IMPLANT
POUCH SPECIMEN RETRIEVAL 10MM (ENDOMECHANICALS) ×3 IMPLANT
RELOAD 45 VASCULAR/THIN (ENDOMECHANICALS) IMPLANT
RELOAD STAPLE 45 2.5 WHT GRN (ENDOMECHANICALS) IMPLANT
RELOAD STAPLE 45 3.5 BLU ETS (ENDOMECHANICALS) IMPLANT
RELOAD STAPLE TA45 3.5 REG BLU (ENDOMECHANICALS) ×3 IMPLANT
SCISSORS LAP 5X35 DISP (ENDOMECHANICALS) ×3 IMPLANT
SET IRRIG TUBING LAPAROSCOPIC (IRRIGATION / IRRIGATOR) ×3 IMPLANT
SHEARS HARMONIC ACE PLUS 36CM (ENDOMECHANICALS) ×2 IMPLANT
SLEEVE XCEL OPT CAN 5 100 (ENDOMECHANICALS) ×3 IMPLANT
SPONGE GAUZE 2X2 STER 10/PKG (GAUZE/BANDAGES/DRESSINGS) ×2
SUT MNCRL AB 4-0 PS2 18 (SUTURE) ×3 IMPLANT
SUT SILK 2 0 SH (SUTURE) IMPLANT
TOWEL OR 17X26 10 PK STRL BLUE (TOWEL DISPOSABLE) ×3 IMPLANT
TRAY FOLEY W/METER SILVER 14FR (SET/KITS/TRAYS/PACK) ×1 IMPLANT
TRAY FOLEY W/METER SILVER 16FR (SET/KITS/TRAYS/PACK) ×3 IMPLANT
TRAY LAPAROSCOPIC (CUSTOM PROCEDURE TRAY) ×3 IMPLANT
TROCAR BLADELESS OPT 5 100 (ENDOMECHANICALS) ×3 IMPLANT
TROCAR XCEL 12X100 BLDLESS (ENDOMECHANICALS) ×3 IMPLANT

## 2015-08-17 NOTE — ED Provider Notes (Signed)
CSN: [id number]     Arrival date & time 08/17/15  1143 History   First MD Initiated Contact with Patient 08/17/15 1205     Chief Complaint  Patient presents with  . Abdominal Pain     (Consider location/radiation/quality/duration/timing/severity/associated sxs/prior Treatment) HPI George Morrison is a 34 y.o. male with a history of ulcerative colitis on mesalamine therapy, comes in for evaluation of right-sided abdominal pain. Patient states last night while shopping he began to experience a gradual onset right-sided abdominal pain, dissimilar to previous UC flares. He reports associated nausea, "sticking my fingers down my throat to make myself throw up". He also reports a small amount of bloody stool and mild abdominal distention. Pain seems to be intermittent in nature and sometimes worse with palpation in right abdomen. Denies any urinary symptoms, fevers, chills.  Past Medical History  Diagnosis Date  . Anal fissure   . Ulcerative colitis    Past Surgical History  Procedure Laterality Date  . Ganglion cyst excision      left hand   Family History  Problem Relation Age of Onset  . Liver disease Other   . Cancer Other     Breast cancer/aunt  . Cirrhosis Other     aunt died at 80  . Hepatitis Other     viral hepatitis  . Colon cancer Neg Hx   . Esophageal cancer Neg Hx   . Rectal cancer Neg Hx   . Stomach cancer Neg Hx    Social History  Substance Use Topics  . Smoking status: Never Smoker   . Smokeless tobacco: Never Used  . Alcohol Use: Yes     Comment: very occasional    Review of Systems A 10 point review of systems was completed and was negative except for pertinent positives and negatives as mentioned in the history of present illness     Allergies  Codeine  Home Medications   Prior to Admission medications   Medication Sig Start Date End Date Taking? Authorizing Provider  mesalamine (LIALDA) 1.2 G EC tablet Take 2.4 g by mouth 2 (two) times daily.    Yes Historical Provider, MD  simethicone (MYLICON) 80 MG chewable tablet Chew 160 mg by mouth every 6 (six) hours as needed for flatulence.   Yes Historical Provider, MD  traMADol (ULTRAM) 50 MG tablet Take 50 mg by mouth every 6 (six) hours as needed for moderate pain.   Yes Historical Provider, MD  LIALDA 1.2 G EC tablet TAKE 2 TABLETS BY MOUTH TWICE A DAY Patient not taking: Reported on 08/17/2015 03/13/15   Irene Shipper, MD   BP 132/73 mmHg  Pulse 81  Temp(Src) 97.7 F (36.5 C) (Oral)  Resp 20  SpO2 99% Physical Exam  Constitutional: He is oriented to person, place, and time. He appears well-developed and well-nourished.  HENT:  Head: Normocephalic and atraumatic.  Mouth/Throat: Oropharynx is clear and moist.  Eyes: Conjunctivae are normal. Pupils are equal, round, and reactive to light. Right eye exhibits no discharge. Left eye exhibits no discharge. No scleral icterus.  Neck: Neck supple.  Cardiovascular: Normal rate, regular rhythm and normal heart sounds.   Pulmonary/Chest: Effort normal and breath sounds normal. No respiratory distress. He has no wheezes. He has no rales.  Abdominal: Soft.  Tenderness diffusely in right lower quadrant. Negative Murphy's. Abdomen is also somewhat distended. No other peritoneal signs.  Musculoskeletal: He exhibits no tenderness.  Neurological: He is alert and oriented to person, place, and time.  Cranial Nerves II-XII grossly intact  Skin: Skin is warm and dry. No rash noted.  Psychiatric: He has a normal mood and affect.  Nursing note and vitals reviewed.   ED Course  Procedures (including critical care time) Labs Review Labs Reviewed  CBC WITH DIFFERENTIAL/PLATELET  COMPREHENSIVE METABOLIC PANEL  LIPASE, BLOOD  URINALYSIS, ROUTINE W REFLEX MICROSCOPIC (NOT AT [facility])    Imaging Review No results found. I have personally reviewed and evaluated these images and lab results as part of my medical decision-making.   EKG  Interpretation None     Meds given in ED:  Medications  0.9 %  sodium chloride infusion (not administered)  fentaNYL (SUBLIMAZE) injection 50 mcg (50 mcg Intravenous Given 08/17/15 1248)  sodium chloride 0.9 % bolus 1,000 mL (0 mLs Intravenous Stopped 08/17/15 1508)  iohexol (OMNIPAQUE) 300 MG/ML solution 100 mL (100 mLs Intravenous Contrast Given 08/17/15 1430)  iohexol (OMNIPAQUE) 300 MG/ML solution 25 mL (25 mLs Oral Contrast Given 08/17/15 1430)    New Prescriptions   No medications on file   Filed Vitals:   08/17/15 1151 08/17/15 1201 08/17/15 1417  BP: 132/73  117/70  Pulse: 81  76  Temp: 97.7 F (36.5 C)  98 F (36.7 C)  TempSrc: Oral  Oral  Resp: 20  20  SpO2: 100% 99% 97%    MDM  Patient with history of ulcerative colitis here for right-sided abdominal and flank pain. Has had associated nausea, vomiting, abdominal distention. He is tender diffusely in right lower quadrant. Leukocytosis of 13.7, labs otherwise noncontributory. Will obtain CT abdomen. Patient found to have acute appendicitis on CT exam. Last ate at 9:00 AM. Pt NPO Discussed patient presentation and ED course to my attending, Dr. Ashok Cordia who agrees with plan to consult general surgery. Spoke with Gen surgery, Dr. Johney Maine to see patient in ED. Patient taken to OR. Final diagnoses:  Acute appendicitis, unspecified acute appendicitis type        Comer Locket, PA-C 08/17/15 [place], MD 08/18/15 [phone]

## 2015-08-17 NOTE — Progress Notes (Signed)
@UMFCLOGO @  This chart was scribed for George Haber, MD by Thea Alken, ED Scribe. This patient was seen in room 6 and the patient's care was started at 10:37 AM.  Patient ID: George Morrison MRN: [id number], DOB: Jul 07, 1981, 34 y.o. Date of Encounter: 08/17/2015, 10:32 AM  Primary Physician: Scarlette Calico, MD  Chief Complaint:  Chief Complaint  Patient presents with  . Blood In Stools    started last night  . Abdominal Pain  . Excessive Sweating  . Hand Pain    HPI: 34 y.o. year old male with history below presents with abdominal pain that began 1 day ago. Pt states symptoms started yesterday with sharp abdominal pain below right ribs. Shortly after he felt nauseated and states he made himself vomit with some relief to symptoms. He tried to have a BM last night consisting of a small amount of stool and notice blood in stool. Pt was able to drink water today. Pt reports symptoms are similar to when he was diagnosed with ulcer colitis 6 years ago. His gastroenterologist is Dr. Henrene Pastor.    Past Medical History  Diagnosis Date  . Anal fissure   . Ulcerative colitis      Home Meds: Prior to Admission medications   Medication Sig Start Date End Date Taking? Authorizing Provider  LIALDA 1.2 G EC tablet TAKE 2 TABLETS BY MOUTH TWICE A DAY 03/13/15  Yes Irene Shipper, MD    Allergies:  Allergies  Allergen Reactions  . Codeine Rash    dizzy     Social History   Social History  . Marital Status: Married    Spouse Name: N/A  . Number of Children: N/A  . Years of Education: N/A   Occupational History  . Not on file.   Social History Main Topics  . Smoking status: Never Smoker   . Smokeless tobacco: Never Used  . Alcohol Use: Yes     Comment: very occasional  . Drug Use: No  . Sexual Activity: Yes    Birth Control/ Protection: Condom   Other Topics Concern  . Not on file   Social History Narrative   Unemployed-Student   No children   Caffienated beverages-No   Associate Professor Use often-Yes   Regular exercise-Yes   Smoke alarm in home-Yes   Guns or firearms in home-No   History of physical abuse-No           Review of Systems: Constitutional: negative for chills, fever,  weight changes, or fatigue  HEENT: negative for vision changes, hearing loss, congestion, rhinorrhea, ST, epistaxis, or sinus pressure Cardiovascular: negative for chest pain or palpitations Respiratory: negative for hemoptysis, wheezing, shortness of breath, or cough Abdominal: negative for abdominal pain, diarrhea, or constipation. Positive nausea, emesis and blood in stool.  Dermatological: negative for rash Neurologic: negative for headache, dizziness, or syncope All other systems reviewed and are otherwise negative with the exception to those above and in the HPI.   Physical Exam: Blood pressure 128/86, pulse 74, temperature 97.3 F (36.3 C), temperature source Oral, resp. rate 18, height 5' 8"  (1.727 m), weight 180 lb (81.647 kg), SpO2 99 %., Body mass index is 27.38 kg/(m^2). General: Well developed, well nourished, in no acute distress. Acute pain appears pale Head: Normocephalic, atraumatic, eyes without discharge, sclera non-icteric, nares are without discharge. Bilateral auditory canals clear, TM's are without perforation, pearly grey and translucent with reflective cone of light bilaterally. Oral cavity moist, posterior pharynx without exudate, erythema, peritonsillar  abscess, or post nasal drip.  Neck: Supple. No thyromegaly. Full ROM. No lymphadenopathy. Lungs: Clear bilaterally to auscultation without wheezes, rales, or rhonchi. Breathing is unlabored. Chest clear Heart: RRR with S1 S2. No murmurs, rubs, or gallops appreciated. Heart rapid.  Abdomen:  No hepatomegaly. Abdomen firm with rebound and guarding on right side.  Msk:  Strength and tone normal for age. Extremities/Skin: Warm and dry. No clubbing or cyanosis. No edema. No rashes or suspicious lesions. Neuro: Alert  and oriented X 3. Moves all extremities spontaneously. Gait is normal. CNII-XII grossly in tact. Psych:  Responds to questions appropriately with a normal affect.   Results for orders placed or performed in visit on 08/17/15  POCT CBC  Result Value Ref Range   WBC 15.0 (A) 4.6 - 10.2 K/uL   Lymph, poc 3.2 0.6 - 3.4   POC LYMPH PERCENT 21.5 10 - 50 %L   MID (cbc) 0.6 0 - 0.9   POC MID % 4.2 0 - 12 %M   POC Granulocyte 11.1 (A) 2 - 6.9   Granulocyte percent 74.3 37 - 80 %G   RBC 5.26 4.69 - 6.13 M/uL   Hemoglobin 15.3 14.1 - 18.1 g/dL   HCT, POC 47.6 43.5 - 53.7 %   MCV 90.4 80 - 97 fL   MCH, POC 29.0 27 - 31.2 pg   MCHC 32.1 31.8 - 35.4 g/dL   RDW, POC 13.5 %   Platelet Count, POC 319 142 - 424 K/uL   MPV 7.1 0 - 99.8 fL     ASSESSMENT AND PLAN:  34 y.o. year old male with  This chart was scribed in my presence and reviewed by me personally.    ICD-9-CM ICD-10-CM   1. Acute abdomen 789.00 R10.0 POCT CBC     POCT SEDIMENTATION RATE  2. Ulcerative colitis, with rectal bleeding 556.9 K51.911 POCT CBC     POCT SEDIMENTATION RATE     With the acute abdomen, patient needs aggressive evaluation and resuscitation fluids. Therefore I'm sending him to the emergency room.   Signed, George Haber, MD 08/17/2015 10:32 AM

## 2015-08-17 NOTE — Op Note (Addendum)
6:10 PM  PATIENT:  George Morrison  34 y.o. male  Patient Care Team: Janith Lima, MD as PCP - General (Internal Medicine) Irene Shipper, MD (Gastroenterology)  PRE-OPERATIVE DIAGNOSIS:  appendicitis  POST-OPERATIVE DIAGNOSIS:  Acute appendicitis  PROCEDURE:  Procedure(s): APPENDECTOMY LAPAROSCOPIC  SURGEON:  Surgeon(s): Michael Boston, MD  ANESTHESIA:   local and general  EBL:  Total I/O In: 1500 [IV Piggyback:1500] Out: 9629 [[id number]]  Delay start of Pharmacological VTE agent (>24hrs) due to surgical blood loss or risk of bleeding:  no  DRAINS: none   SPECIMEN:  Source of Specimen:   APPENDIX  DISPOSITION OF SPECIMEN:  PATHOLOGY  COUNTS:  YES  PLAN OF CARE: Admit for overnight observation  PATIENT DISPOSITION:  PACU - hemodynamically stable.   INDICATIONS: Patient with concerning symptoms & work up suspicious for appendicitis.  Surgery was recommended:  The anatomy & physiology of the digestive tract was discussed.  The pathophysiology of appendicitis was discussed.  Natural history risks without surgery was discussed.   I feel the risks of no intervention will lead to serious problems that outweigh the operative risks; therefore, I recommended diagnostic laparoscopy with removal of appendix to remove the pathology.  Laparoscopic & open techniques were discussed.   I noted a good likelihood this will help address the problem.    Risks such as bleeding, infection, abscess, leak, reoperation, possible ostomy, hernia, heart attack, death, and other risks were discussed.  Goals of post-operative recovery were discussed as well.  We will work to minimize complications.  Questions were answered.  The patient expresses understanding & wishes to proceed with surgery.  OR FINDINGS: Patient had purulence and phlegmon suspicious for early perforated appendicitis.  At the very least suppurative appendicitis  DESCRIPTION:   The patient was identified & brought into the  operating room. The patient was positioned supine with arms tucked. SCDs were active during the entire case. The patient underwent general anesthesia without any difficulty.  The abdomen was prepped and draped in a sterile fashion. A Surgical Timeout confirmed our plan.  I made a transverse incision through the superior umbilical fold.  I made a small transverse nick through the infraumbilical fascia and confirmed peritoneal entry.  I placed a 24m port.  We induced carbon dioxide insufflation.  Camera inspection revealed no injury.  I placed additional ports under direct laparoscopic visualization.  I mobilized the terminal ileum to proximal ascending colon in a lateral to medial fashion.  I took care to avoid injuring any retroperitoneal structures.  I located an obviously inflamed appendix.  Phlegmon.  There was some separation and purulence nearby.  No gangrene yet.  Suspicious for early perforated appendicitis.  I freed the appendix off its attachments to the ascending colon and cecal mesentery.  I elevated the appendix. I skeletonized the mesoappendix. I was able to free off the base of the appendix which was still viable.  I stapled the appendix off the cecum using a laparoscopic stapler. I took a healthy 1cm cuff of viable cecum. I ligated the mesoappendix and assured hemostasis in the mesentery.   I placed the appendix inside an EndoCatch bag and removed out the 12 mm port.  I had to dilate the port wound to get the thickened appendix out.  I did copious irrigation. Hemostasis was good in the mesoappendix, colon mesentery, and retroperitoneum. Staple line was intact on the cecum with no bleeding. I washed out the pelvis, retrohepatic space and right paracolic gutter. I washed  out the left side as well.  Hemostasis is good. There was no perforation or injury. Because the area cleaned up well after irrigation, I did not place a drain.  I aspirated the carbon dioxide. I removed the ports. I closed the  12 mm fascia site using a 0 Vicryl & #1 PDS stitch. I closed skin using 4-0 monocryl stitch.  Patient was extubated and sent to the recovery room.  I discussed the operative findings with the patient's family. I suspect the patient is going used in the hospital at least overnight and will need antibiotics for 5 days - e-Rx'd  Questions answered.  Patient was hoping to leave now.  I strongly cautioned against that.  I think he was hoping to have visitation with his son.  His wife agreed with the patient staying until it is safe to go home.  As he is healthy, hopefully could be as soon as tomorrow.  Then again, he could have an ileus with the perforation.  We will see.  They expressed understanding and appreciation.  Adin Hector, M.D., F.A.C.S. Gastrointestinal and Minimally Invasive Surgery [facility] [address], [place], [place] [id number] [phone] Main / Paging

## 2015-08-17 NOTE — Transfer of Care (Signed)
Immediate Anesthesia Transfer of Care Note  Patient: George Morrison  Procedure(s) Performed: Procedure(s): APPENDECTOMY LAPAROSCOPIC (N/A)  Patient Location: PACU  Anesthesia Type:General  Level of Consciousness: awake, alert  and oriented  Airway & Oxygen Therapy: Patient Spontanous Breathing and Patient connected to face mask oxygen  Post-op Assessment: Report given to RN and Post -op Vital signs reviewed and stable  Post vital signs: Reviewed and stable  Last Vitals:  Filed Vitals:   08/17/15 1417  BP: 117/70  Pulse: 76  Temp: 36.7 C  Resp: 20    Complications: No apparent anesthesia complications

## 2015-08-17 NOTE — ED Notes (Signed)
CHG - done LR infusing Permit signed Antibiotics to OR

## 2015-08-17 NOTE — ED Notes (Signed)
Abdominal pain concentrated on the right side.  He has nausea and has vomited x2 since this began ("he made himself throw up")

## 2015-08-17 NOTE — Anesthesia Preprocedure Evaluation (Signed)
Anesthesia Evaluation  Patient identified by MRN, date of birth, ID band Patient awake    Reviewed: Allergy & Precautions, NPO status , Patient's Chart, lab work & pertinent test results  Airway Mallampati: II  TM Distance: >3 FB Neck ROM: Full    Dental no notable dental hx. (+) Teeth Intact   Pulmonary neg pulmonary ROS,  breath sounds clear to auscultation  Pulmonary exam normal       Cardiovascular Normal cardiovascular examRhythm:Regular Rate:Normal     Neuro/Psych negative neurological ROS  negative psych ROS   GI/Hepatic Neg liver ROS, PUD, Acute appendicitis Hx/o Ulcerative colitis   Endo/Other  negative endocrine ROS  Renal/GU negative Renal ROS  negative genitourinary   Musculoskeletal negative musculoskeletal ROS (+)   Abdominal (+)  Abdomen: tender.    Peds  Hematology negative hematology ROS (+)   Anesthesia Other Findings   Reproductive/Obstetrics                             Anesthesia Physical Anesthesia Plan  ASA: II  Anesthesia Plan: General   Post-op Pain Management:    Induction: Intravenous  Airway Management Planned: Natural Airway  Additional Equipment:   Intra-op Plan:   Post-operative Plan: Extubation in OR  Informed Consent: I have reviewed the patients History and Physical, chart, labs and discussed the procedure including the risks, benefits and alternatives for the proposed anesthesia with the patient or authorized representative who has indicated his/her understanding and acceptance.   Dental advisory given  Plan Discussed with: CRNA, Anesthesiologist and Surgeon  Anesthesia Plan Comments:         Anesthesia Quick Evaluation

## 2015-08-17 NOTE — Anesthesia Procedure Notes (Signed)
Procedure Name: Intubation Date/Time: 08/17/2015 4:57 PM Performed by: Noralyn Pick D Pre-anesthesia Checklist: Patient identified, Emergency Drugs available, Suction available and Patient being monitored Patient Re-evaluated:Patient Re-evaluated prior to inductionOxygen Delivery Method: Circle System Utilized Preoxygenation: Pre-oxygenation with 100% oxygen Intubation Type: IV induction, Cricoid Pressure applied and Rapid sequence Laryngoscope Size: Mac and 4 Grade View: Grade I Tube type: Oral Tube size: 7.5 mm Number of attempts: 1 Airway Equipment and Method: Stylet and Oral airway Placement Confirmation: ETT inserted through vocal cords under direct vision,  positive ETCO2 and breath sounds checked- equal and bilateral Secured at: 22 cm Tube secured with: Tape Dental Injury: Teeth and Oropharynx as per pre-operative assessment

## 2015-08-17 NOTE — Discharge Instructions (Signed)
LAPAROSCOPIC SURGERY: POST OP INSTRUCTIONS  1. DIET: Follow a light bland diet the first 24 hours after arrival home, such as soup, liquids, crackers, etc.  Be sure to include lots of fluids daily.  Avoid fast food or heavy meals as your are more likely to get nauseated.  Eat a low fat the next few days after surgery.   2. Take your usually prescribed home medications unless otherwise directed. 3. PAIN CONTROL: a. Pain is best controlled by a usual combination of three different methods TOGETHER: i. Ice/Heat ii. Over the counter pain medication iii. Prescription pain medication b. Most patients will experience some swelling and bruising around the incisions.  Ice packs or heating pads (30-60 minutes up to 6 times a day) will help. Use ice for the first few days to help decrease swelling and bruising, then switch to heat to help relax tight/sore spots and speed recovery.  Some people prefer to use ice alone, heat alone, alternating between ice & heat.  Experiment to what works for you.  Swelling and bruising can take several weeks to resolve.   c. It is helpful to take an over-the-counter pain medication regularly for the first few weeks.  Choose one of the following that works best for you: i. Naproxen (Aleve, etc)  Two 25m tabs twice a day ii. Ibuprofen (Advil, etc) Three 2090mtabs four times a day (every meal & bedtime) iii. Acetaminophen (Tylenol, etc) 500-65070mour times a day (every meal & bedtime) d. A  prescription for pain medication (such as oxycodone, hydrocodone, etc) should be given to you upon discharge.  Take your pain medication as prescribed.  i. If you are having problems/concerns with the prescription medicine (does not control pain, nausea, vomiting, rash, itching, etc), please call us [phone] see if we need to switch you to a different pain medicine that will work better for you and/or control your side effect better. ii. If you need a refill on your pain medication,  please contact your pharmacy.  They will contact our office to request authorization. Prescriptions will not be filled after 5 pm or on week-ends. 4. Avoid getting constipated.  Between the surgery and the pain medications, it is common to experience some constipation.  Increasing fluid intake and taking a fiber supplement (such as Metamucil, Citrucel, FiberCon, MiraLax, etc) 1-2 times a day regularly will usually help prevent this problem from occurring.  A mild laxative (prune juice, Milk of Magnesia, MiraLax, etc) should be taken according to package directions if there are no bowel movements after 48 hours.   5. Watch out for diarrhea.  If you have many loose bowel movements, simplify your diet to bland foods & liquids for a few days.  Stop any stool softeners and decrease your fiber supplement.  Switching to mild anti-diarrheal medications (Kayopectate, Pepto Bismol) can help.  If this worsens or does not improve, please call us.[place]. Wash / shower every day.  You may shower over the dressings as they are waterproof.  Continue to shower over incision(s) after the dressing is off. 7. Remove your waterproof bandages 5 days after surgery.  You may leave the incision open to air.  You may replace a dressing/Band-Aid to cover the incision for comfort if you wish.  8. ACTIVITIES as tolerated:   a. You may resume regular (light) daily activities beginning the next day--such as daily self-care, walking, climbing stairs--gradually increasing activities as tolerated.  If you can walk 30 minutes without difficulty, it  is safe to try more intense activity such as jogging, treadmill, bicycling, low-impact aerobics, swimming, etc. b. Save the most intensive and strenuous activity for last such as sit-ups, heavy lifting, contact sports, etc  Refrain from any heavy lifting or straining until you are off narcotics for pain control.   c. DO NOT PUSH THROUGH PAIN.  Let pain be your guide: If it hurts to do something, don't  do it.  Pain is your body warning you to avoid that activity for another week until the pain goes down. d. You may drive when you are no longer taking prescription pain medication, you can comfortably wear a seatbelt, and you can safely maneuver your car and apply brakes. e. Dennis Bast may have sexual intercourse when it is comfortable.  9. FOLLOW UP in our office a. Please call [facility] at [phone] to set up an appointment to see your surgeon in the office for a follow-up appointment approximately 2-3 weeks after your surgery. b. Make sure that you call for this appointment the day you arrive home to insure a convenient appointment time. 10. IF YOU HAVE DISABILITY OR FAMILY LEAVE FORMS, BRING THEM TO THE OFFICE FOR PROCESSING.  DO NOT GIVE THEM TO YOUR DOCTOR.   WHEN TO CALL us [phone]: 1. Poor pain control 2. Reactions / problems with new medications (rash/itching, nausea, etc)  3. Fever over 101.5 F (38.5 C) 4. Inability to urinate 5. Nausea and/or vomiting 6. Worsening swelling or bruising 7. Continued bleeding from incision. 8. Increased pain, redness, or drainage from the incision   The clinic staff is available to answer your questions during regular business hours (8:30am-5pm).  Please dont hesitate to call and ask to speak to one of our nurses for clinical concerns.   If you have a medical emergency, go to the nearest emergency room or call 911.  A surgeon from [facility] is always on call at the [facility], [place], [place], [place], [place]  [address] ? MAIN: [phone] ? TOLL FREE: [phone] ?  FAX [phone] [email or web address]   Appendicitis Appendicitis is when the appendix is swollen (inflamed). The inflammation can lead to developing a hole (perforation) and a collection of pus (abscess). CAUSES  There is not always an obvious cause of appendicitis. Sometimes it is caused by an  obstruction in the appendix. The obstruction can be caused by:  A small, hard, pea-sized ball of stool (fecalith).  Enlarged lymph glands in the appendix. SYMPTOMS   Pain around your belly button (navel) that moves toward your lower right belly (abdomen). The pain can become more severe and sharp as time passes.  Tenderness in the lower right abdomen. Pain gets worse if you cough or make a sudden movement.  Feeling sick to your stomach (nauseous).  Throwing up (vomiting).  Loss of appetite.  Fever.  Constipation.  Diarrhea.  Generally not feeling well. DIAGNOSIS   Physical exam.  Blood tests.  Urine test.  X-rays or a CT scan may confirm the diagnosis. TREATMENT  Once the diagnosis of appendicitis is made, the most common treatment is to remove the appendix as soon as possible. This procedure is called appendectomy. In an open appendectomy, a cut (incision) is made in the lower right abdomen and the appendix is removed. In a laparoscopic appendectomy, usually 3 small incisions are made. Long, thin instruments and a camera tube are used to remove the appendix. Most patients go home  in 24 to 48 hours after appendectomy. In some situations, the appendix may have already perforated and an abscess may have formed. The abscess may have a "wall" around it as seen on a CT scan. In this case, a drain may be placed into the abscess to remove fluid, and you may be treated with antibiotic medicines that kill germs. The medicine is given through a tube in your vein (IV). Once the abscess has resolved, it may or may not be necessary to have an appendectomy. You may need to stay in the hospital longer than 48 hours. Document Released: 12/07/2005 Document Revised: 06/07/2012 Document Reviewed: 03/04/2010 [facility] Patient Information 2015 [place], [place]. This information is not intended to replace advice given to you by your health care provider. Make sure you discuss any questions you have with  your health care provider.   Laparoscopic Appendectomy Appendectomy is surgery to remove the appendix. Laparoscopic surgery uses several small cuts (incisions) instead of one large incision. Laparoscopic surgery offers a shorter recovery time and less discomfort. LET YOUR CAREGIVER KNOW ABOUT:  Allergies to food or medicine.  Medicines taken, including vitamins, dietary supplements, herbs, eyedrops, over-the-counter medicines, and creams.  Use of steroids (by mouth or creams).  Previous problems with anesthetics or numbing medicines.  History of bleeding problems or blood clots.  Previous surgery.  Other health problems, including diabetes, heart problems, lung problems, and kidney problems.  Possibility of pregnancy, if this applies. RISKS AND COMPLICATIONS  Infection. A germ starts growing in the wound. This can usually be treated with antibiotics. In some cases, the wound will need to be opened and cleaned.  Bleeding.  Damage to other organs.  Sores (abscesses).  Chronic pain at the incision sites. This is defined as pain that lasts for more than 3 months.  Blood clots in the legs that may rarely travel to the lungs.  Infection in the lungs (pneumonia). BEFORE THE PROCEDURE Appendectomy is usually performed immediately after an inflamed appendix (appendicitis) is diagnosed. No preparation is necessary ahead of this procedure. PROCEDURE  You will be given medicine that makes you sleep (general anesthetic). After you are asleep, a flexible tube (catheter) may be inserted into your bladder to drain your urine during surgery. The tube is removed before you wake up after surgery. When you are asleep, carbondioxide gas will be used to inflate your abdomen. This will allow your surgeon to see inside your abdomen and perform your surgery. Three small incisions will be made in your abdomen. Your surgeon will insert a thin, lighted tube (laparoscope) through one of the incisions.  Your surgeon will look through the laparoscope while performing the surgery. Other tools will be inserted through the other incisions. Laparoscopic procedures may not be appropriate when:  There is major scarring from a previous surgery.  The patient has bleeding disorders.  A pregnancy is near term.  There are other conditions which make the laparoscopic procedure impossible, such as an advanced infection or a ruptured appendix. If your surgeon feels it is not safe to continue with the laparoscopic procedure, he or she will perform an open surgery instead. This gives the surgeon a larger view and more space to work. Open surgery requires a longer recovery time. After your appendix is removed, your incisions will be closed with stitches (sutures) or skin adhesive. AFTER THE PROCEDURE You will be taken to a recovery room. When the anesthesia has worn off, you will be returned to your hospital room. You will be given  pain medicines to keep you comfortable. Ask your caregiver how long your hospital stay will be. Document Released: 07/21/2004 Document Revised: 02/29/2012 Document Reviewed: 06/16/2011 [facility] Patient Information 2015 [place], [place]. This information is not intended to replace advice given to you by your health care provider. Make sure you discuss any questions you have with your health care provider.  Managing Pain  Pain after surgery or related to activity is often due to strain/injury to muscle, tendon, nerves and/or incisions.  This pain is usually short-term and will improve in a few months.   Many people find it helpful to do the following things TOGETHER to help speed the process of healing and to get back to regular activity more quickly:  1. Avoid heavy physical activity at first a. No lifting greater than 20 pounds at first, then increase to lifting as tolerated over the next few weeks b. Do not push through the pain.  Listen to your body and avoid positions and  maneuvers than reproduce the pain.  Wait a few days before trying something more intense c. Walking is okay as tolerated, but go slowly and stop when getting sore.  If you can walk 30 minutes without stopping or pain, you can try more intense activity (running, jogging, aerobics, cycling, swimming, treadmill, sex, sports, weightlifting, etc ) d. Remember: If it hurts to do it, then dont do it!  2. Take Anti-inflammatory medication a. Choose ONE of the following over-the-counter medications: i.            Acetaminophen 517m tabs (Tylenol) 1-2 pills with every meal and just before bedtime (avoid if you have liver problems) ii.            Naproxen 2248mtabs (ex. Aleve) 1-2 pills twice a day (avoid if you have kidney, stomach, IBD, or bleeding problems) iii. Ibuprofen 20073mabs (ex. Advil, Motrin) 3-4 pills with every meal and just before bedtime (avoid if you have kidney, stomach, IBD, or bleeding problems) b. Take with food/snack around the clock for 1-2 weeks i. This helps the muscle and nerve tissues become less irritable and calm down faster  3. Use a Heating pad or Ice/Cold Pack a. 4-6 times a day b. May use warm bath/hottub  or showers  4. Try Gentle Massage and/or Stretching  a. at the area of pain many times a day b. stop if you feel pain - do not overdo it  Try these steps together to help you body heal faster and avoid making things get worse.  Doing just one of these things may not be enough.    If you are not getting better after two weeks or are noticing you are getting worse, contact our office for further advice; we may need to re-evaluate you & see what other things we can do to help.  GETTING TO GOOD BOWEL HEALTH. Irregular bowel habits such as constipation and diarrhea can lead to many problems over time.  Having one soft bowel movement a day is the most important way to prevent further problems.  The anorectal canal is designed to handle stretching and feces to safely  manage our ability to get rid of solid waste (feces, poop, stool) out of our body.  BUT, hard constipated stools can act like ripping concrete bricks and diarrhea can be a burning fire to this very sensitive area of our body, causing inflamed hemorrhoids, anal fissures, increasing risk is perirectal abscesses, abdominal pain/bloating, an making irritable bowel worse.  The goal: ONE SOFT BOWEL MOVEMENT A DAY!  To have soft, regular bowel movements:   Drink plenty of fluids, consider 4-6 tall glasses of water a day.    Take plenty of fiber.  Fiber is the undigested part of plant food that passes into the colon, acting s natures broom to encourage bowel motility and movement.  Fiber can absorb and hold large amounts of water. This results in a larger, bulkier stool, which is soft and easier to pass. Work gradually over several weeks up to 6 servings a day of fiber (25g a day even more if needed) in the form of: o Vegetables -- Root (potatoes, carrots, turnips), leafy green (lettuce, salad greens, celery, spinach), or cooked high residue (cabbage, broccoli, etc) o Fruit -- Fresh (unpeeled skin & pulp), Dried (prunes, apricots, cherries, etc ),  or stewed ( applesauce)  o Whole grain breads, pasta, etc (whole wheat)  o Bran cereals   Bulking Agents -- This type of water-retaining fiber generally is easily obtained each day by one of the following:  o Psyllium bran -- The psyllium plant is remarkable because its ground seeds can retain so much water. This product is available as Metamucil, Konsyl, Effersyllium, Per Diem Fiber, or the less expensive generic preparation in drug and health food stores. Although labeled a laxative, it really is not a laxative.  o Methylcellulose -- This is another fiber derived from wood which also retains water. It is available as Citrucel. o Polyethylene Glycol - and artificial fiber commonly called Miralax or Glycolax.  It is helpful for people with gassy or bloated  feelings with regular fiber o Flax Seed - a less gassy fiber than psyllium  No reading or other relaxing activity while on the toilet. If bowel movements take longer than 5 minutes, you are too constipated  AVOID CONSTIPATION.  High fiber and water intake usually takes care of this.  Sometimes a laxative is needed to stimulate more frequent bowel movements, but   Laxatives are not a good long-term solution as it can wear the colon out.  They can help jump-start bowels if constipated, but should be relied on constantly without discussing with your doctor o Osmotics (Milk of Magnesia, Fleets phosphosoda, Magnesium citrate, MiraLax, GoLytely) are safer than  o Stimulants (Senokot, Castor Oil, Dulcolax, Ex Lax)    o Avoid taking laxatives for more than 7 days in a row.   IF SEVERELY CONSTIPATED, try a Bowel Retraining Program: o Do not use laxatives.  o Eat a diet high in roughage, such as bran cereals and leafy vegetables.  o Drink six (6) ounces of prune or apricot juice each morning.  o Eat two (2) large servings of stewed fruit each day.  o Take one (1) heaping tablespoon of a psyllium-based bulking agent twice a day. Use sugar-free sweetener when possible to avoid excessive calories.  o Eat a normal breakfast.  o Set aside 15 minutes after breakfast to sit on the toilet, but do not strain to have a bowel movement.  o If you do not have a bowel movement by the third day, use an enema and repeat the above steps.   Controlling diarrhea o Switch to liquids and simpler foods for a few days to avoid stressing your intestines further. o Avoid dairy products (especially milk & ice cream) for a short time.  The intestines often can lose the ability to digest lactose when stressed. o Avoid foods that cause gassiness or bloating.  Typical  foods include beans and other legumes, cabbage, broccoli, and dairy foods.  Every person has some sensitivity to other foods, so listen to our body and avoid those  foods that trigger problems for you. o Adding fiber (Citrucel, Metamucil, psyllium, Miralax) gradually can help thicken stools by absorbing excess fluid and retrain the intestines to act more normally.  Slowly increase the dose over a few weeks.  Too much fiber too soon can backfire and cause cramping & bloating. o Probiotics (such as active yogurt, Align, etc) may help repopulate the intestines and colon with normal bacteria and calm down a sensitive digestive tract.  Most studies show it to be of mild help, though, and such products can be costly. o Medicines: - Bismuth subsalicylate (ex. Kayopectate, Pepto Bismol) every 30 minutes for up to 6 doses can help control diarrhea.  Avoid if pregnant. - Loperamide (Immodium) can slow down diarrhea.  Start with two tablets (5m total) first and then try one tablet every 6 hours.  Avoid if you are having fevers or severe pain.  If you are not better or start feeling worse, stop all medicines and call your doctor for advice o Call your doctor if you are getting worse or not better.  Sometimes further testing (cultures, endoscopy, X-ray studies, bloodwork, etc) may be needed to help diagnose and treat the cause of the diarrhea.  TROUBLESHOOTING IRREGULAR BOWELS 1) Avoid extremes of bowel movements (no bad constipation/diarrhea) 2) Miralax 17gm mixed in 8oz. water or juice-daily. May use BID as needed.  3) Gas-x,Phazyme, etc. as needed for gas & bloating.  4) Soft,bland diet. No spicy,greasy,fried foods.  5) Prilosec over-the-counter as needed  6) May hold gluten/wheat products from diet to see if symptoms improve.  7)  May try probiotics (Align, Activa, etc) to help calm the bowels down 7) If symptoms become worse call back immediately.

## 2015-08-17 NOTE — H&P (Addendum)
[place]  [place] George Morrison, George Morrison Phone: [phone] FAX: [phone]     George Morrison  [birth year] [id number]  CARE TEAM:  PCP: Scarlette Calico, MD  Outpatient Care Team: Patient Care Team: Janith Lima, MD as PCP - General (Internal Medicine) Irene Shipper, MD (Gastroenterology)  Inpatient Treatment Team: Treatment Team: Attending Provider: Lajean Saver, MD; Technician: Lynder Parents Ratchford, NT; Registered Nurse: Jola Schmidt, RN; Physician Assistant: Comer Locket, PA-C; Consulting Physician: Nolon Nations, MD  This patient is a 34 y.o.male who presents today for surgical evaluation at the request of Dr Ashok Cordia.   Reason for evaluation: Acute appendicitis  Pleasant active male.  Bilingual Hispanic.  Wife who is a Marine scientist at [employer] is at the bedside.  Patient notes abdominal pain starting yesterday.  Became more intense and right sided.  Felt very nauseated.  Force himself to vomit.  No relief.  Was concerned.  Came to emergency room.  History physical and CT scan findings concerning for acute appendicitis.  Surgical consultation requested.  Patient does have a history of ulcerative colitis.  She is a been left-sided.  Followed by Dr. [name] with [facility] gastroenterology.  No recent flares.  Has some mild hematochezia.  No major changes.  No sick contacts or travel history.  No severe constipation or diarrhea.  He did have a prednisone taper that he finished 2 weeks ago for severe case of poison ivy.  It was a short course.  Rash resolved.  Normally is rather active at work with moderate activity.  No severe shortness of breath or dyspnea on exertion.  No history of cardiac or pulmonary issues.  He does not smoke.  No prior abdominal surgery.  No obvious problems with urination or defecation.  Past Medical History  Diagnosis Date  . Anal fissure   . Ulcerative colitis     Past Surgical History  Procedure  Laterality Date  . Ganglion cyst excision      left hand    Social History   Social History  . Marital Status: Married    Spouse Name: N/A  . Number of Children: N/A  . Years of Education: N/A   Occupational History  . 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   Social History Main Topics  . Smoking status: Never Smoker   . Smokeless tobacco: Never Used  . Alcohol Use: Yes     Comment: very occasional  . Drug Use: No  . Sexual Activity: Yes    Birth Control/ Protection: Condom   Other Topics Concern  . Not on file   Social History Narrative   Unemployed-Student   No children   Caffienated beverages-No   Seat Belt Use often-Yes   Regular exercise-Yes   Smoke alarm in home-Yes   Guns or firearms in home-No   History of physical abuse-No          Family History  Problem Relation Age of Onset  . Liver disease Other   . Cancer Other     Breast cancer/aunt  . Cirrhosis Other     aunt died at 31  . Hepatitis Other     viral hepatitis  . Colon cancer Neg Hx   . Esophageal cancer Neg Hx   . Rectal cancer Neg Hx   . Stomach cancer Neg Hx     Current Facility-Administered Medications  Medication Dose Route Frequency Provider Last Rate Last  Dose  . 0.9 %  sodium chloride infusion   Intravenous Once Lajean Saver, MD       Current Outpatient Prescriptions  Medication Sig Dispense Refill  . mesalamine (LIALDA) 1.2 G EC tablet Take 2.4 g by mouth 2 (two) times daily.    . simethicone (MYLICON) 80 MG chewable tablet Chew 160 mg by mouth every 6 (six) hours as needed for flatulence.    . traMADol (ULTRAM) 50 MG tablet Take 50 mg by mouth every 6 (six) hours as needed for moderate pain.    Marland Kitchen LIALDA 1.2 G EC tablet TAKE 2 TABLETS BY MOUTH TWICE A DAY (Patient not taking: Reported on 08/17/2015) 120 tablet 2     Allergies  Allergen Reactions  . Codeine Rash    dizzy     ROS: Constitutional:  No fevers, chills, sweats.  Weight stable Eyes:  No vision changes,  No discharge HENT:  No sore throats, nasal drainage Lymph: No neck swelling, No bruising easily Pulmonary:  No cough, productive sputum CV: No orthopnea, PND  Patient walks 60 minutes for about 2 miles without difficulty.  No exertional chest/neck/shoulder/arm pain. GI:  No personal nor family history of GI/colon cancer, irritable bowel syndrome, allergy such as Celiac Sprue, dietary/dairy problems, colitis, ulcers nor gastritis.  No recent sick contacts/gastroenteritis.  No travel outside the country.  No changes in diet. Renal: No UTIs, No hematuria.  +ULCERATIVE COLITIS Genital:  No drainage, bleeding, masses Musculoskeletal: No severe joint pain.  Good ROM major joints Skin:  No sores or lesions.  No rashes Heme/Lymph:  No easy bleeding.  No swollen lymph nodes Neuro: No focal weakness/numbness.  No seizures Psych: No suicidal ideation.  No hallucinations  BP 117/70 mmHg  Pulse 76  Temp(Src) 98 F (36.7 C) (Oral)  Resp 20  SpO2 97%  Physical Exam: General: Pt awake/alert/oriented x4 in no major acute distress Eyes: PERRL, normal EOM. Sclera nonicteric Neuro: CN II-XII intact w/o focal sensory/motor deficits. Lymph: No head/neck/groin lymphadenopathy Psych:  No delerium/psychosis/paranoia HENT: Normocephalic, Mucus membranes moist.  No thrush Neck: Supple, No tracheal deviation Chest: No pain.  Good respiratory excursion. CV:  Pulses intact.  Regular rhythm Abdomen: Rather firm, especially infraumbilically. Nondistended.  TTP RLQ with mild guarding.  No incarcerated hernias. Ext:  SCDs BLE.  No significant edema.  No cyanosis Skin: No petechiae / purpurea.  No major sores Musculoskeletal: No severe joint pain.  Good ROM major joints   Results:   Labs: Results for orders placed or performed during the hospital encounter of 08/17/15 (from the past 48 hour(s))  Urinalysis, Routine w reflex microscopic (not at [facility])     Status: Abnormal   Collection Time: 08/17/15 12:50 PM   Result Value Ref Range   Color, Urine YELLOW YELLOW   APPearance CLOUDY (A) CLEAR   Specific Gravity, Urine 1.020 1.005 - 1.030   pH 8.5 (H) 5.0 - 8.0   Glucose, UA NEGATIVE NEGATIVE mg/dL   Hgb urine dipstick NEGATIVE NEGATIVE   Bilirubin Urine NEGATIVE NEGATIVE   Ketones, ur NEGATIVE NEGATIVE mg/dL   Protein, ur NEGATIVE NEGATIVE mg/dL   Urobilinogen, UA 0.2 0.0 - 1.0 mg/dL   Nitrite NEGATIVE NEGATIVE   Leukocytes, UA NEGATIVE NEGATIVE    Comment: MICROSCOPIC NOT DONE ON URINES WITH NEGATIVE PROTEIN, BLOOD, LEUKOCYTES, NITRITE, OR GLUCOSE <1000 mg/dL.  CBC with Differential     Status: Abnormal   Collection Time: 08/17/15 12:53 PM  Result Value Ref Range   WBC 13.7 (  H) 4.0 - 10.5 K/uL   RBC 4.74 4.22 - 5.81 MIL/uL   Hemoglobin 14.6 13.0 - 17.0 g/dL   HCT 42.9 39.0 - 52.0 %   MCV 90.5 78.0 - 100.0 fL   MCH 30.8 26.0 - 34.0 pg   MCHC 34.0 30.0 - 36.0 g/dL   RDW 13.3 11.5 - 15.5 %   Platelets 299 150 - 400 K/uL   Neutrophils Relative % 79 (H) 43 - 77 %   Neutro Abs 10.8 (H) 1.7 - 7.7 K/uL   Lymphocytes Relative 14 12 - 46 %   Lymphs Abs 1.9 0.7 - 4.0 K/uL   Monocytes Relative 7 3 - 12 %   Monocytes Absolute 1.0 0.1 - 1.0 K/uL   Eosinophils Relative 0 0 - 5 %   Eosinophils Absolute 0.0 0.0 - 0.7 K/uL   Basophils Relative 0 0 - 1 %   Basophils Absolute 0.0 0.0 - 0.1 K/uL  Comprehensive metabolic panel     Status: Abnormal   Collection Time: 08/17/15 12:53 PM  Result Value Ref Range   Sodium 137 135 - 145 mmol/L   Potassium 3.3 (L) 3.5 - 5.1 mmol/L   Chloride 107 101 - 111 mmol/L   CO2 24 22 - 32 mmol/L   Glucose, Bld 114 (H) 65 - 99 mg/dL   BUN 13 6 - 20 mg/dL   Creatinine, Ser 0.82 0.61 - 1.24 mg/dL   Calcium 8.8 (L) 8.9 - 10.3 mg/dL   Total Protein 7.6 6.5 - 8.1 g/dL   Albumin 4.4 3.5 - 5.0 g/dL   AST 22 15 - 41 U/L   ALT 26 17 - 63 U/L   Alkaline Phosphatase 66 38 - 126 U/L   Total Bilirubin 1.1 0.3 - 1.2 mg/dL   GFR calc non Af Amer >60 >60 mL/min   GFR calc  Af Amer >60 >60 mL/min    Comment: (NOTE) The eGFR has been calculated using the CKD EPI equation. This calculation has not been validated in all clinical situations. eGFR's persistently <60 mL/min signify possible Chronic Kidney Disease.    Anion gap 6 5 - 15  Lipase, blood     Status: Abnormal   Collection Time: 08/17/15 12:53 PM  Result Value Ref Range   Lipase 18 (L) 22 - 51 U/L    Imaging / Studies: Ct Abdomen Pelvis W Contrast  08/17/2015   CLINICAL DATA:  Abdominal pain concentrated on the right side. He has nausea and has vomited x2 since this began which was yesterday  EXAM: CT ABDOMEN AND PELVIS WITH CONTRAST  TECHNIQUE: Multidetector CT imaging of the abdomen and pelvis was performed using the standard protocol following bolus administration of intravenous contrast.  CONTRAST:  152m OMNIPAQUE IOHEXOL 300 MG/ML SOLN, 29mOMNIPAQUE IOHEXOL 300 MG/ML SOLN  COMPARISON:  None.  FINDINGS: Appendix: Distended, measuring 1 cm in diameter with surrounding inflammatory change. Appendix curls along the medial margin of the cecum and lower ascending colon. No extraluminal air or evidence of an abscess.  Gastrointestinal:  Stomach, colon and small bowel are unremarkable.  Lung bases:  Essentially clear.  Heart normal size.  Liver, spleen, gallbladder, pancreas, adrenal glands:  Normal.  Kidneys, ureters, bladder:  Normal.  Lymph nodes:  No enlarged lymph nodes.  Ascites: None.  Musculoskeletal:  Unremarkable.  IMPRESSION: 1. Acute appendicitis. No evidence of appendiceal rupture or of a periappendiceal abscess. 2. No other abnormalities.   Electronically Signed   By: DaDedra Skeens.  On: 08/17/2015 14:44   Dg Knee Complete 4 Views Left  07/21/2015   CLINICAL DATA:  LEFT knee injury playing soccer 1 day prior. Initial encounter.  EXAM: LEFT KNEE - COMPLETE 4+ VIEW  COMPARISON:  None.  FINDINGS: No fracture of the proximal tibia or distal femur. Patella is normal. No joint effusion.  IMPRESSION:  No fracture or dislocation.   Electronically Signed   By: Suzy Bouchard M.D.   On: 07/21/2015 11:05    Medications / Allergies: per chart  Antibiotics: Anti-infectives    None      Assessment  George Morrison  34 y.o. male       Problem List:  Principal Problem:   Acute appendicitis Active Problems:   ULCERATIVE COLITIS-LEFT SIDE   History and physical and CT scan strongly suspicious for acute appendicitis.  Chronic ulcerative colitis with no evidence of major flare side of some mild hematochezia.  Plan:  Admit.  IV fluids.  IV antibiotics.  Diagnostic laparoscopy with appendectomy:  The anatomy & physiology of the digestive tract was discussed.  The pathophysiology of appendicitis and other appendiceal disorders were discussed.  Natural history risks without surgery was discussed.   I feel the risks of no intervention will lead to serious problems that outweigh the operative risks; therefore, I recommended diagnostic laparoscopy with removal of appendix to remove the pathology.  Laparoscopic & open techniques were discussed.   I noted a good likelihood this will help address the problem.   Risks such as bleeding, infection, abscess, leak, reoperation, possible ostomy, hernia, heart attack, stroke, death, and other risks were discussed.  Goals of post-operative recovery were discussed as well.  We will work to minimize complications.  Questions were answered.  The patient expresses understanding & wishes to proceed with surgery.   -VTE prophylaxis- SCDs, etc -mobilize as tolerated to help recovery    Adin Hector, M.D., F.A.C.S. Gastrointestinal [facility] Minimally [facility] [address], [place], [place] [address] [phone] Main / Paging   08/17/2015  Note: Portions of this report may have been transcribed using voice recognition software. Every effort was made to ensure accuracy; however, inadvertent  computerized transcription errors may be present.   Any transcriptional errors that result from this process are unintentional.

## 2015-08-17 NOTE — Anesthesia Postprocedure Evaluation (Signed)
  Anesthesia Post-op Note  Patient: George Morrison  Procedure(s) Performed: Procedure(s): APPENDECTOMY LAPAROSCOPIC (N/A)  Patient Location: PACU  Anesthesia Type:General  Level of Consciousness: awake, alert  and oriented  Airway and Oxygen Therapy: Patient Spontanous Breathing  Post-op Pain: mild  Post-op Assessment: Post-op Vital signs reviewed, Patient's Cardiovascular Status Stable, Respiratory Function Stable, Patent Airway, No signs of Nausea or vomiting and Pain level controlled              Post-op Vital Signs: Reviewed and stable  Last Vitals:  Filed Vitals:   08/17/15 1830  BP: 117/64  Pulse: 60  Temp: 37.1 C  Resp: 19    Complications: No apparent anesthesia complications

## 2015-08-18 ENCOUNTER — Encounter: Payer: Self-pay | Admitting: Surgery

## 2015-08-18 MED ORDER — OXYCODONE HCL 5 MG PO TABS: 5.0000 mg | ORAL_TABLET | ORAL | Status: DC | PRN

## 2015-08-18 MED ORDER — SODIUM CHLORIDE 0.9 % IJ SOLN: 3.0000 mL | INTRAMUSCULAR | Status: DC | PRN

## 2015-08-18 MED ORDER — AMOXICILLIN-POT CLAVULANATE 875-125 MG PO TABS: 1.0000 | ORAL_TABLET | Freq: Two times a day (BID) | ORAL | Status: DC

## 2015-08-18 MED ORDER — SODIUM CHLORIDE 0.9 % IJ SOLN
3.0000 mL | Freq: Two times a day (BID) | INTRAMUSCULAR | Status: DC
Start: 2015-08-18 — End: 2015-08-18

## 2015-08-18 MED ORDER — ACETAMINOPHEN 500 MG PO TABS
1000.0000 mg | ORAL_TABLET | Freq: Three times a day (TID) | ORAL | Status: DC
  Administered 2015-08-18: 1000 mg via ORAL

## 2015-08-18 MED ORDER — LACTATED RINGERS IV BOLUS (SEPSIS): 1000.0000 mL | Freq: Three times a day (TID) | INTRAVENOUS | Status: DC | PRN

## 2015-08-18 MED ORDER — SODIUM CHLORIDE 0.9 % IV SOLN: 250.0000 mL | INTRAVENOUS | Status: DC | PRN

## 2015-08-18 NOTE — Progress Notes (Signed)
Discharged from floor via w/c, family & belongings with pt. No changes in assessment. Kiernan Atkerson, [employer]

## 2015-08-18 NOTE — Discharge Summary (Signed)
Physician Discharge Summary  Patient ID:  George Morrison  MRN: [id number]  DOB/AGE: May 01, 1981 34 y.o.  Admit date: 08/17/2015 Discharge date: 08/18/2015  Discharge Diagnoses:   Principal Problem:   Appendicitis with peritonitis s/p lap appy 08/17/2015 Active Problems:   ULCERATIVE COLITIS-LEFT SIDE  Operation: Procedure(s): APPENDECTOMY LAPAROSCOPIC on 08/17/2015 - S. Gross  Discharged Condition: good  Hospital Course: George Morrison is an 34 y.o. male whose primary care physician is Scarlette Calico, MD and who was admitted 08/17/2015 with a chief complaint of  Chief Complaint  Patient presents with  . Abdominal Pain  .   He was brought to the operating room on 08/17/2015 and underwent  APPENDECTOMY LAPAROSCOPIC.   He has done well and is ready to go home.  The discharge instructions were reviewed with the patient.  Consults: None  Significant Diagnostic Studies: Results for orders placed or performed during the hospital encounter of 08/17/15  CBC with Differential  Result Value Ref Range   WBC 13.7 (H) 4.0 - 10.5 K/uL   RBC 4.74 4.22 - 5.81 MIL/uL   Hemoglobin 14.6 13.0 - 17.0 g/dL   HCT 42.9 39.0 - 52.0 %   MCV 90.5 78.0 - 100.0 fL   MCH 30.8 26.0 - 34.0 pg   MCHC 34.0 30.0 - 36.0 g/dL   RDW 13.3 11.5 - 15.5 %   Platelets 299 150 - 400 K/uL   Neutrophils Relative % 79 (H) 43 - 77 %   Neutro Abs 10.8 (H) 1.7 - 7.7 K/uL   Lymphocytes Relative 14 12 - 46 %   Lymphs Abs 1.9 0.7 - 4.0 K/uL   Monocytes Relative 7 3 - 12 %   Monocytes Absolute 1.0 0.1 - 1.0 K/uL   Eosinophils Relative 0 0 - 5 %   Eosinophils Absolute 0.0 0.0 - 0.7 K/uL   Basophils Relative 0 0 - 1 %   Basophils Absolute 0.0 0.0 - 0.1 K/uL  Comprehensive metabolic panel  Result Value Ref Range   Sodium 137 135 - 145 mmol/L   Potassium 3.3 (L) 3.5 - 5.1 mmol/L   Chloride 107 101 - 111 mmol/L   CO2 24 22 - 32 mmol/L   Glucose, Bld 114 (H) 65 - 99 mg/dL   BUN 13 6 - 20 mg/dL   Creatinine, Ser 0.82  0.61 - 1.24 mg/dL   Calcium 8.8 (L) 8.9 - 10.3 mg/dL   Total Protein 7.6 6.5 - 8.1 g/dL   Albumin 4.4 3.5 - 5.0 g/dL   AST 22 15 - 41 U/L   ALT 26 17 - 63 U/L   Alkaline Phosphatase 66 38 - 126 U/L   Total Bilirubin 1.1 0.3 - 1.2 mg/dL   GFR calc non Af Amer >60 >60 mL/min   GFR calc Af Amer >60 >60 mL/min   Anion gap 6 5 - 15  Lipase, blood  Result Value Ref Range   Lipase 18 (L) 22 - 51 U/L  Urinalysis, Routine w reflex microscopic (not at [facility])  Result Value Ref Range   Color, Urine YELLOW YELLOW   APPearance CLOUDY (A) CLEAR   Specific Gravity, Urine 1.020 1.005 - 1.030   pH 8.5 (H) 5.0 - 8.0   Glucose, UA NEGATIVE NEGATIVE mg/dL   Hgb urine dipstick NEGATIVE NEGATIVE   Bilirubin Urine NEGATIVE NEGATIVE   Ketones, ur NEGATIVE NEGATIVE mg/dL   Protein, ur NEGATIVE NEGATIVE mg/dL   Urobilinogen, UA 0.2 0.0 - 1.0 mg/dL   Nitrite NEGATIVE NEGATIVE  Leukocytes, UA NEGATIVE NEGATIVE    Ct Abdomen Pelvis W Contrast  08/17/2015   CLINICAL DATA:  Abdominal pain concentrated on the right side. He has nausea and has vomited x2 since this began which was yesterday  EXAM: CT ABDOMEN AND PELVIS WITH CONTRAST  TECHNIQUE: Multidetector CT imaging of the abdomen and pelvis was performed using the standard protocol following bolus administration of intravenous contrast.  CONTRAST:  1103m OMNIPAQUE IOHEXOL 300 MG/ML SOLN, 258mOMNIPAQUE IOHEXOL 300 MG/ML SOLN  COMPARISON:  None.  FINDINGS: Appendix: Distended, measuring 1 cm in diameter with surrounding inflammatory change. Appendix curls along the medial margin of the cecum and lower ascending colon. No extraluminal air or evidence of an abscess.  Gastrointestinal:  Stomach, colon and small bowel are unremarkable.  Lung bases:  Essentially clear.  Heart normal size.  Liver, spleen, gallbladder, pancreas, adrenal glands:  Normal.  Kidneys, ureters, bladder:  Normal.  Lymph nodes:  No enlarged lymph nodes.  Ascites: None.  Musculoskeletal:   Unremarkable.  IMPRESSION: 1. Acute appendicitis. No evidence of appendiceal rupture or of a periappendiceal abscess. 2. No other abnormalities.   Electronically Signed   By: DaLajean Manes.D.   On: 08/17/2015 14:44   Dg Knee Complete 4 Views Left  07/21/2015   CLINICAL DATA:  LEFT knee injury playing soccer 1 day prior. Initial encounter.  EXAM: LEFT KNEE - COMPLETE 4+ VIEW  COMPARISON:  None.  FINDINGS: No fracture of the proximal tibia or distal femur. Patella is normal. No joint effusion.  IMPRESSION: No fracture or dislocation.   Electronically Signed   By: StSuzy Bouchard.D.   On: 07/21/2015 11:05    Discharge Exam:  Filed Vitals:   08/18/15 0508  BP: 105/53  Pulse: 82  Temp: 98.3 F (36.8 C)  Resp: 14    General: WN M who is alert.  Lungs: Clear to auscultation and symmetric breath sounds. Heart:  RRR. No murmur or rub. Abdomen: Soft. No mass.  Normal bowel sounds. Dressing looks good.   Discharge Medications:     Medication List    TAKE these medications        amoxicillin-clavulanate 875-125 MG per tablet  Commonly known as:  AUGMENTIN  Take 1 tablet by mouth 2 (two) times daily.     mesalamine 1.2 G EC tablet  Commonly known as:  LIALDA  Take 2.4 g by mouth 2 (two) times daily.     simethicone 80 MG chewable tablet  Commonly known as:  MYLICON  Chew 16353g by mouth every 6 (six) hours as needed for flatulence.     traMADol 50 MG tablet  Commonly known as:  ULTRAM  Take 1-2 tablets (50-100 mg total) by mouth every 6 (six) hours as needed for moderate pain.        Disposition: 01-Home or Self Care      Discharge Instructions    Call MD for:  extreme fatigue    Complete by:  As directed      Call MD for:  hives    Complete by:  As directed      Call MD for:  persistant nausea and vomiting    Complete by:  As directed      Call MD for:  redness, tenderness, or signs of infection (pain, swelling, redness, odor or green/yellow discharge around  incision site)    Complete by:  As directed      Call MD for:  severe uncontrolled pain  Complete by:  As directed      Call MD for:    Complete by:  As directed   Temperature > 101.69F     Diet - low sodium heart healthy    Complete by:  As directed      Discharge instructions    Complete by:  As directed   Please see discharge instruction sheets.  Also refer to handout given an office.  Please call our office if you have any questions or concerns [phone]     Discharge wound care:    Complete by:  As directed   If you have closed incisions, shower and bathe over these incisions with soap and water every day.  Remove all surgical dressings on postoperative day #3.  You do not need to replace dressings over the closed incisions unless you feel more comfortable with a Band-Aid covering it.   If you have an open wound that requires packing, please see wound care instructions.  In general, remove all dressings, wash wound with soap and water and then replace with saline moistened gauze.  Do the dressing change at least every day.  Please call our office [phone] if you have further questions.     Driving Restrictions    Complete by:  As directed   No driving until off narcotics and can safely swerve away without pain during an emergency     Increase activity slowly    Complete by:  As directed   Walk an hour a day.  Use 20-30 minute walks.  When you can walk 30 minutes without difficulty, it is fine to restart low impact/moderate activities such as biking, jogging, swimming, sexual activity, etc.  Eventually you can increase to unrestricted activity when not feeling pain.  If you feel pain: STOP!Marland Kitchen   Let pain protect you from overdoing it.  Use ice/heat & over-the-counter pain medications to help minimize soreness.  If that is not enough, then use your narcotic pain prescription as needed to remain active.  It is better to take extra pain medications and be more active than to stay  bedridden to avoid all pain medications.     Lifting restrictions    Complete by:  As directed   Avoid heavy lifting initially.  Do not push through pain.  You have no specific weight limit - if it hurts to do, DON'T DO IT.   If you feel no pain, you are not injuring anything.  Pain will protect you from injury.  Coughing and sneezing are far more stressful to your incision than any lifting.  Avoid resuming heavy lifting / intense activity until off all narcotic pain medications.  When ready to exercise more, give yourself 2 weeks to gradually get back to full intense exercise/activity.     May shower / Bathe    Complete by:  As directed      May walk up steps    Complete by:  As directed      Sexual Activity Restrictions    Complete by:  As directed   Sexual activity as tolerated.  Do not push through pain.  Pain will protect you from injury.     Walk with assistance    Complete by:  As directed   Walk over an hour a day.  May use a walker/cane/companion to help with balance and stamina.           Follow-up Information    Follow up with Adin Hector., MD. Schedule  an appointment as soon as possible for a visit in 2 weeks.   Specialty:  General Surgery   Why:  To follow up after your operation, To follow up after your hospital stay   Contact information:   [place] [address] [phone]        Signed: Alphonsa Overall, M.D., [facility] Surgery Office:  [phone]  08/18/2015, 9:16 AM

## 2015-08-19 ENCOUNTER — Encounter (HOSPITAL_COMMUNITY): Payer: Self-pay | Admitting: Surgery

## 2015-09-04 ENCOUNTER — Emergency Department (HOSPITAL_COMMUNITY)
Admission: EM | Admit: 2015-09-04 | Discharge: 2015-09-05 | Disposition: A | Discharge status: Home / Self Care | Payer: Managed Care, Other (non HMO) | Attending: Emergency Medicine | Admitting: Emergency Medicine

## 2015-09-04 ENCOUNTER — Encounter (HOSPITAL_COMMUNITY): Payer: Self-pay | Admitting: Emergency Medicine

## 2015-09-04 DIAGNOSIS — R509 Fever, unspecified: Secondary | ICD-10-CM | POA: Diagnosis not present

## 2015-09-04 DIAGNOSIS — K529 Noninfective gastroenteritis and colitis, unspecified: Secondary | ICD-10-CM | POA: Insufficient documentation

## 2015-09-04 DIAGNOSIS — E860 Dehydration: Secondary | ICD-10-CM | POA: Insufficient documentation

## 2015-09-04 DIAGNOSIS — Z79899 Other long term (current) drug therapy: Secondary | ICD-10-CM | POA: Diagnosis not present

## 2015-09-04 DIAGNOSIS — R197 Diarrhea, unspecified: Secondary | ICD-10-CM | POA: Diagnosis present

## 2015-09-04 LAB — BASIC METABOLIC PANEL
ANION GAP: 11 (ref 5–15)
BUN: 18 mg/dL (ref 6–20)
CHLORIDE: 107 mmol/L (ref 101–111)
CO2: 18 mmol/L — AB (ref 22–32)
Calcium: 9.3 mg/dL (ref 8.9–10.3)
Creatinine, Ser: 0.99 mg/dL (ref 0.61–1.24)
GFR calc non Af Amer: >60 mL/min (ref >60)
GLUCOSE: 118 mg/dL — AB (ref 65–99)
Potassium: 3.8 mmol/L (ref 3.5–5.1)
Sodium: 136 mmol/L (ref 135–145)

## 2015-09-04 LAB — CBC
HEMATOCRIT: 43.4 % (ref 39.0–52.0)
HEMOGLOBIN: 15.7 g/dL (ref 13.0–17.0)
MCH: 31.7 pg (ref 26.0–34.0)
MCHC: 36.2 g/dL — AB (ref 30.0–36.0)
MCV: 87.7 fL (ref 78.0–100.0)
Platelets: 237 10*3/uL (ref 150–400)
RBC: 4.95 MIL/uL (ref 4.22–5.81)
RDW: 13.1 % (ref 11.5–15.5)
WBC: 10.2 10*3/uL (ref 4.0–10.5)

## 2015-09-04 MED ORDER — SODIUM CHLORIDE 0.9 % IV BOLUS (SEPSIS)
2000.0000 mL | Freq: Once | INTRAVENOUS | Status: AC
  Administered 2015-09-05: 2000 mL via INTRAVENOUS

## 2015-09-04 NOTE — ED Provider Notes (Signed)
CSN: [id number]     Arrival date & time 09/04/15  2159 History  This chart was scribed for Julianne Rice, MD by Randa Evens, ED Scribe. This patient was seen in room WA22/WA22 and the patient's care was started at 11:40 PM.     Chief Complaint  Patient presents with  . Diarrhea  . Dehydration    The history is provided by the patient. No language interpreter was used.   HPI Comments: George Morrison is a 34 y.o. male who presents to the Emergency Department complaining of diarrhea onset 3 am today. Pt describes the diarrhea as water and filled with mucous. >20 episodes. Pt reports nausea, lightheadedness, and fever. Pt states that he has tried tylenol with relief of the fever. He states that the dizziness is worse when standing. Pt reports having a laparoscopic appendectomy on 08/17/2015. Pt does report seeing his surgeon yesterday prior to onset of diarrhea. Pt denies any recent sick contacts or suspicious food intake. Pt denies abdominal pain, vomiting, numbness or other related symptoms.   Past Medical History  Diagnosis Date  . Anal fissure   . Ulcerative colitis    Past Surgical History  Procedure Laterality Date  . Ganglion cyst excision      left hand  . Laparoscopic appendectomy N/A 08/17/2015    Procedure: APPENDECTOMY LAPAROSCOPIC;  Surgeon: Michael Boston, MD;  Location: [facility];  Service: General;  Laterality: N/A;   Family History  Problem Relation Age of Onset  . Liver disease Other   . Cancer Other     Breast cancer/aunt  . Cirrhosis Other     aunt died at 37  . Hepatitis Other     viral hepatitis  . Colon cancer Neg Hx   . Esophageal cancer Neg Hx   . Rectal cancer Neg Hx   . Stomach cancer Neg Hx    Social History  Substance Use Topics  . Smoking status: Never Smoker   . Smokeless tobacco: Never Used  . Alcohol Use: No     Comment: very occasional    Review of Systems  Constitutional: Positive for fever and fatigue.  Respiratory: Negative for  shortness of breath.   Cardiovascular: Negative for chest pain.  Gastrointestinal: Positive for nausea and diarrhea. Negative for vomiting, abdominal pain and blood in stool.  Musculoskeletal: Negative for myalgias, back pain, neck pain and neck stiffness.  Skin: Negative for rash and wound.  Neurological: Positive for dizziness and light-headedness. Negative for weakness, numbness and headaches.  All other systems reviewed and are negative.    Allergies  Codeine  Home Medications   Prior to Admission medications   Medication Sig Start Date End Date Taking? Authorizing Provider  acetaminophen (TYLENOL) 500 MG tablet Take 1,000 mg by mouth every 6 (six) hours as needed.   Yes Historical Provider, MD  mesalamine (LIALDA) 1.2 G EC tablet Take 2.4 g by mouth 2 (two) times daily.   Yes Historical Provider, MD  traMADol (ULTRAM) 50 MG tablet Take 1-2 tablets (50-100 mg total) by mouth every 6 (six) hours as needed for moderate pain. 08/17/15  Yes Michael Boston, MD  amoxicillin-clavulanate (AUGMENTIN) 875-125 MG per tablet Take 1 tablet by mouth 2 (two) times daily. Patient not taking: Reported on 09/04/2015 08/17/15   Michael Boston, MD  ciprofloxacin (CIPRO) 500 MG tablet Take 1 tablet (500 mg total) by mouth 2 (two) times daily. One po bid x 7 days 09/05/15   Julianne Rice, MD  dicyclomine (BENTYL) 20  MG tablet Take 1 tablet (20 mg total) by mouth 2 (two) times daily as needed for spasms. 09/05/15   Julianne Rice, MD  metroNIDAZOLE (FLAGYL) 500 MG tablet Take 1 tablet (500 mg total) by mouth 2 (two) times daily. One po bid x 7 days 09/05/15   Julianne Rice, MD  oxyCODONE-acetaminophen (PERCOCET) 5-325 MG per tablet Take 1-2 tablets by mouth every 4 (four) hours as needed for moderate pain or severe pain. 09/05/15   Julianne Rice, MD  simethicone (MYLICON) 80 MG chewable tablet Chew 160 mg by mouth every 6 (six) hours as needed for flatulence.    Historical Provider, MD   BP 131/74 mmHg  Pulse  81  Temp(Src) 98.4 F (36.9 C) (Oral)  Resp 17  SpO2 99%   Physical Exam  Constitutional: He is oriented to person, place, and time. He appears well-developed and well-nourished. No distress.  HENT:  Head: Normocephalic and atraumatic.  Mouth/Throat: Oropharynx is clear and moist. No oropharyngeal exudate.  Eyes: EOM are normal. Pupils are equal, round, and reactive to light.  No nystagmus  Neck: Normal range of motion. Neck supple.  Cardiovascular: Normal rate and regular rhythm.   Pulmonary/Chest: Effort normal and breath sounds normal. No respiratory distress. He has no wheezes. He has no rales. He exhibits no tenderness.  Abdominal: Soft. Bowel sounds are normal. He exhibits no distension and no mass. There is no tenderness. There is no rebound and no guarding.  Musculoskeletal: Normal range of motion. He exhibits no edema or tenderness.  Neurological: He is alert and oriented to person, place, and time.  5/5 motor in all extremities. Sensation fully intact.  Skin: Skin is warm and dry. No rash noted. No erythema.  Psychiatric: He has a normal mood and affect. His behavior is normal.  Nursing note and vitals reviewed.   ED Course  Procedures (including critical care time) DIAGNOSTIC STUDIES: Oxygen Saturation is 98% on RA, normal by my interpretation.    COORDINATION OF CARE: 11:53 PM-Discussed treatment plan with pt at bedside and pt agreed to plan.     Labs Review Labs Reviewed  CBC - Abnormal; Notable for the following:    MCHC 36.2 (*)    All other components within normal limits  BASIC METABOLIC PANEL - Abnormal; Notable for the following:    CO2 18 (*)    Glucose, Bld 118 (*)    All other components within normal limits  STOOL CULTURE  GI PATHOGEN PANEL BY PCR, STOOL    Imaging Review Ct Abdomen Pelvis W Contrast  09/05/2015   CLINICAL DATA:  Abdominal cramping and diarrhea. Low-grade fever. Symptoms for 1 day. Post appendectomy 2 weeks prior.  EXAM: CT  ABDOMEN AND PELVIS WITH CONTRAST  TECHNIQUE: Multidetector CT imaging of the abdomen and pelvis was performed using the standard protocol following bolus administration of intravenous contrast.  CONTRAST:  183m OMNIPAQUE IOHEXOL 300 MG/ML  SOLN  COMPARISON:  CT 08/17/2015  FINDINGS: The liver, gallbladder, spleen, pancreas, and adrenal glands are normal. The kidneys demonstrate symmetric enhancement without hydronephrosis or localizing abnormality.  The stomach is physiologically distended. Contrast opacifies normal appearing proximal small bowel loops. There is a small bowel intussusception in the left upper quadrant of the abdomen. More distal small bowel loops are normal in caliber.  Patient is post appendectomy with surgical clips at the bases cecum. There is no adjacent inflammation, fluid collection or abscess. Diffuse colonic wall thickening spanning from the cecum through the sigmoid colon. Minimal adjacent  pericolonic inflammatory change. No ascites or free air.  No retroperitoneal adenopathy. Abdominal aorta is normal in caliber.  Within the pelvis the bladder is physiologically distended. No bladder wall thickening. Minimal fat in the left inguinal canal. Prostate gland is normal.  There are no acute or suspicious osseous abnormalities.  IMPRESSION: 1. Post appendectomy without postoperative fluid collection or abscess. 2. Diffuse near pan colitis. This is likely infectious or inflammatory. 3. Small bowel small bowel intussusception in the left upper quadrant of the abdomen. In adults this is most commonly transient and of doubtful clinical significance.   Electronically Signed   By: Jeb Levering M.D.   On: 09/05/2015 04:51      EKG Interpretation None      MDM   Final diagnoses:  Colitis  Dehydration      I personally performed the services described in this documentation, which was scribed in my presence. The recorded information has been reviewed and is accurate.   Patient  states his abdominal pain has improved. He's had greater than 15 watery diarrhea since arriving in the emergency department. His dizziness is also improved after IV fluids and a dose of meclizine. CT reveals near pan colitis. Patient is anxious to go home. His gastrologist is Dr. Henrene Pastor. Will discuss with Dr. Henrene Pastor follow-up and possible restarting meds for his ulcerative colitis. C. difficile was sent  Discussed with Dr. Fuller Plan. He believes that sudden onset diarrhea isn't uncommon presentation for inflammatory colitis. Concern for possible infectious colitis. He thinks that the patient looks well that he can follow-up as an outpatient. Agrees with starting the patient on antibiotics. Stool culture sent. We'll give IV Cipro and Flagyl in the emergency department.  Anticipate discharge home when and vitals are complete. To follow-up with Gastroenterology. Signed out to oncoming emergency physician.  Julianne Rice, MD 09/05/15 [phone]

## 2015-09-04 NOTE — ED Notes (Signed)
Pt states he had a lap appy 2 weeks ago by Dr Johney [place]  Pt states he started having diarrhea this morning around 3am  Pt states after that around 9am this morning he started feeling very bad  Pt states he feels weak, tired, dizzy, shaky, and having numbness in his fingers  Pt states he has had a fever and took tylenol 1069m prior to arrival  Pt denies pain at this time

## 2015-09-04 NOTE — ED Notes (Signed)
Pt reports constant diarrhea since 3am yesterday with approx 20 episodes of diarrhea today.  He says his rectum is sore from repeated bowel movements. He also says he is dizzy and had a fever at work earlier today.  Current temp is 98.8 orally and pt reports taking 1000 mg of tylenol about an hour ago. No other symptoms. He is grimacing, restless, and appears to be uncomfortable in bed.

## 2015-09-05 ENCOUNTER — Encounter (HOSPITAL_COMMUNITY): Payer: Self-pay | Admitting: Radiology

## 2015-09-05 ENCOUNTER — Emergency Department (HOSPITAL_COMMUNITY): Payer: Managed Care, Other (non HMO)

## 2015-09-05 ENCOUNTER — Other Ambulatory Visit: Payer: Self-pay

## 2015-09-05 ENCOUNTER — Telehealth: Payer: Self-pay | Admitting: Internal Medicine

## 2015-09-05 MED ORDER — MECLIZINE HCL 25 MG PO TABS
25.0000 mg | ORAL_TABLET | Freq: Once | ORAL | Status: AC
  Administered 2015-09-05: 25 mg via ORAL
  Filled 2015-09-05: qty 1

## 2015-09-05 MED ORDER — FENTANYL CITRATE (PF) 100 MCG/2ML IJ SOLN
50.0000 ug | Freq: Once | INTRAMUSCULAR | Status: AC
  Administered 2015-09-05: 50 ug via INTRAVENOUS
  Filled 2015-09-05: qty 2

## 2015-09-05 MED ORDER — IOHEXOL 300 MG/ML  SOLN
50.0000 mL | Freq: Once | INTRAMUSCULAR | Status: AC | PRN
  Administered 2015-09-05: 50 mL via ORAL

## 2015-09-05 MED ORDER — CIPROFLOXACIN HCL 500 MG PO TABS: 500.0000 mg | ORAL_TABLET | Freq: Two times a day (BID) | ORAL | Status: DC

## 2015-09-05 MED ORDER — METRONIDAZOLE IN NACL 5-0.79 MG/ML-% IV SOLN
500.0000 mg | Freq: Once | INTRAVENOUS | Status: AC
  Administered 2015-09-05: 500 mg via INTRAVENOUS
  Filled 2015-09-05: qty 100

## 2015-09-05 MED ORDER — METRONIDAZOLE 500 MG PO TABS: 500.0000 mg | ORAL_TABLET | Freq: Two times a day (BID) | ORAL | Status: DC

## 2015-09-05 MED ORDER — DICYCLOMINE HCL 20 MG PO TABS
10.0000 mg | ORAL_TABLET | Freq: Once | ORAL | Status: AC
  Administered 2015-09-05: 10 mg via ORAL
  Filled 2015-09-05: qty 1

## 2015-09-05 MED ORDER — OXYCODONE-ACETAMINOPHEN 5-325 MG PO TABS: 1.0000 | ORAL_TABLET | ORAL | Status: DC | PRN

## 2015-09-05 MED ORDER — CIPROFLOXACIN IN D5W 400 MG/200ML IV SOLN
400.0000 mg | Freq: Once | INTRAVENOUS | Status: AC
  Administered 2015-09-05: 400 mg via INTRAVENOUS
  Filled 2015-09-05: qty 200

## 2015-09-05 MED ORDER — SODIUM CHLORIDE 0.9 % IV BOLUS (SEPSIS)
1000.0000 mL | Freq: Once | INTRAVENOUS | Status: AC
  Administered 2015-09-05: 1000 mL via INTRAVENOUS

## 2015-09-05 MED ORDER — IOHEXOL 300 MG/ML  SOLN
100.0000 mL | Freq: Once | INTRAMUSCULAR | Status: AC | PRN
  Administered 2015-09-05: 100 mL via INTRAVENOUS

## 2015-09-05 MED ORDER — DICYCLOMINE HCL 20 MG PO TABS: 20.0000 mg | ORAL_TABLET | Freq: Two times a day (BID) | ORAL | Status: DC | PRN

## 2015-09-05 MED ORDER — HYDROCORTISONE 100 MG/60ML RE ENEM
1.0000 | ENEMA | Freq: Two times a day (BID) | RECTAL | Status: DC
Start: 2015-09-05 — End: 2015-09-06

## 2015-09-05 NOTE — Telephone Encounter (Signed)
yes

## 2015-09-05 NOTE — Telephone Encounter (Signed)
Cort-enemas sent to pharmacy. Wife states pt is off tomorrow and they would really like to be seen. App appts are now full. Dr. Hilarie Fredrickson there is has been a cancellation on your schedule tomorrow at 2:45pm. You are doc of the day tomorrow. Pt had CT done today. Please advise if pt could be seen at 2:45pm tomorrow.

## 2015-09-05 NOTE — ED Notes (Signed)
Pt to CT

## 2015-09-05 NOTE — Telephone Encounter (Signed)
Pts wife states pt was seen in the ER, states they are not sure what was going on with him. Possible UC flare, has blood in his stool and mucous. Offered pt an appt with [facility] tomorrow but states pt cannot miss work. Offered appt Tuesday afternoon but is was not late enough in the day. Pt scheduled to see Amy Esterwood PA 09/13/15@3pm . Pts wife aware of appt.  Pt has had cort enemas in the past that he used in the am and pm. Requesting prescription for these to use until OV. Dr. Deatra Ina as doc of the day is it ok to prescribe the cort enemas? Please advise.

## 2015-09-05 NOTE — Telephone Encounter (Signed)
Pt aware and will see Dr. Hilarie Fredrickson tomorrow at 2:45pm.

## 2015-09-05 NOTE — Discharge Instructions (Signed)
°  Call and make an appointment to follow-up with your gastroenterologist. Make sure drinking plenty of fluids. Return immediately for worsening pain, persistent vomiting, ongoing lightheadedness or for any concerns.  Colitis Colitis is inflammation of the colon. Colitis can be a short-term or long-standing (chronic) illness. Crohn's disease and ulcerative colitis are 2 types of colitis which are chronic. They usually require lifelong treatment. CAUSES  There are many different causes of colitis, including:  Viruses.  Germs (bacteria).  Medicine reactions. SYMPTOMS   Diarrhea.  Intestinal bleeding.  Pain.  Fever.  Throwing up (vomiting).  Tiredness (fatigue).  Weight loss.  Bowel blockage. DIAGNOSIS  The diagnosis of colitis is based on examination and stool or blood tests. X-rays, CT scan, and colonoscopy may also be needed. TREATMENT  Treatment may include:  Fluids given through the vein (intravenously).  Bowel rest (nothing to eat or drink for a period of time).  Medicine for pain and diarrhea.  Medicines (antibiotics) that kill germs.  Cortisone medicines.  Surgery. HOME CARE INSTRUCTIONS   Get plenty of rest.  Drink enough water and fluids to keep your urine clear or pale yellow.  Eat a well-balanced diet.  Call your caregiver for follow-up as recommended. SEEK IMMEDIATE MEDICAL CARE IF:   You develop chills.  You have an oral temperature above 102 F (38.9 C), not controlled by medicine.  You have extreme weakness, fainting, or dehydration.  You have repeated vomiting.  You develop severe belly (abdominal) pain or are passing bloody or tarry stools. MAKE SURE YOU:   Understand these instructions.  Will watch your condition.  Will get help right away if you are not doing well or get worse. Document Released: 01/14/2005 Document Revised: 02/29/2012 Document Reviewed: 04/11/2010 [facility] Patient Information 2015 [place], [place]. This  information is not intended to replace advice given to you by your health care provider. Make sure you discuss any questions you have with your health care provider.

## 2015-09-05 NOTE — Telephone Encounter (Signed)
Can place on my schedule for urgent visit while Dr. Henrene Pastor is away given probably UC flare

## 2015-09-06 ENCOUNTER — Ambulatory Visit (INDEPENDENT_AMBULATORY_CARE_PROVIDER_SITE_OTHER): Payer: Managed Care, Other (non HMO) | Admitting: Internal Medicine

## 2015-09-06 ENCOUNTER — Encounter: Payer: Self-pay | Admitting: Internal Medicine

## 2015-09-06 VITALS — BP 112/70 | HR 76 | Ht 68.0 in | Wt 176.0 lb

## 2015-09-06 DIAGNOSIS — K51911 Ulcerative colitis, unspecified with rectal bleeding: Secondary | ICD-10-CM | POA: Diagnosis not present

## 2015-09-06 DIAGNOSIS — R933 Abnormal findings on diagnostic imaging of other parts of digestive tract: Secondary | ICD-10-CM | POA: Diagnosis not present

## 2015-09-06 MED ORDER — MESALAMINE 1.2 G PO TBEC: 2.4000 g | DELAYED_RELEASE_TABLET | Freq: Two times a day (BID) | ORAL | Status: DC

## 2015-09-06 NOTE — Progress Notes (Signed)
Subjective:    Patient ID: Chritopher Coster, male    DOB: 1981-05-31, 34 y.o.   MRN: [id number]  HPI Jameson Sebastien Jackson is a 34 year old male with a history of left-sided ulcerative colitis diagnosed in 2010 maintained previously on Lialda at times Rowasa enemas who is seen as an urgent work in with recent symptoms of colitis flare. He became sick around 08/17/2015 and was diagnosed with appendicitis by CT scan. He underwent appendectomy and was recovering uneventfully. He then developed diarrhea with urgency and scant rectal bleeding. He is having cramping abdominal pain. He reports are somewhat an overlap in symptoms between his appendicitis discomfort and his current symptoms. He has noted low-grade fever and chills. He has continued Lialda 2.4 g twice daily. CT scan was repeated which showed diffuse pancolitis. GI pathogen panel was obtained and is pending. He was started on empiric Cipro and metronidazole. He denies nausea, vomiting. After starting antibiotics yesterday he feels considerably better today. He denies fever today. Appetite overall has been decreased but slightly better today.  His last colonoscopy was on 02/02/2014 with Dr. Henrene Pastor. This was normal.  Review of Systems as per history of present illness, otherwise negative  Current Medications, Allergies, Past Medical History, Past Surgical History, Family History and Social History were reviewed in [employer] record.      Objective:   Physical Exam BP 112/70 mmHg  Pulse 76  Ht 5' 8"  (1.727 m)  Wt 176 lb (79.833 kg)  BMI 26.77 kg/m2 Constitutional: Well-developed and well-nourished. No distress. HEENT: Normocephalic and atraumatic. Oropharynx is clear and moist. No oropharyngeal exudate. Conjunctivae are normal.  No scleral icterus. Neck: Neck supple. Trachea midline. Cardiovascular: Normal rate, regular rhythm and intact distal pulses. No M/R/G Pulmonary/chest: Effort normal and breath sounds normal. No  wheezing, rales or rhonchi. Abdominal: Soft, nontender, nondistended. Bowel sounds active throughout. There are no masses palpable. No hepatosplenomegaly. Extremities: no clubbing, cyanosis, or edema Lymphadenopathy: No cervical adenopathy noted. Neurological: Alert and oriented to person place and time. Skin: Skin is warm and dry. No rashes noted. Psychiatric: Normal mood and affect. Behavior is normal.  CBC    Component Value Date/Time   WBC 10.2 09/04/2015 2237   WBC 15.0* 08/17/2015 1105   RBC 4.95 09/04/2015 2237   RBC 5.26 08/17/2015 1105   HGB 15.7 09/04/2015 2237   HGB 15.3 08/17/2015 1105   HCT 43.4 09/04/2015 2237   HCT 47.6 08/17/2015 1105   PLT 237 09/04/2015 2237   MCV 87.7 09/04/2015 2237   MCV 90.4 08/17/2015 1105   MCH 31.7 09/04/2015 2237   MCH 29.0 08/17/2015 1105   MCHC 36.2* 09/04/2015 2237   MCHC 32.1 08/17/2015 1105   RDW 13.1 09/04/2015 2237   LYMPHSABS 1.9 08/17/2015 1253   MONOABS 1.0 08/17/2015 1253   EOSABS 0.0 08/17/2015 1253   BASOSABS 0.0 08/17/2015 1253    CMP     Component Value Date/Time   NA 136 09/04/2015 2237   K 3.8 09/04/2015 2237   CL 107 09/04/2015 2237   CO2 18* 09/04/2015 2237   GLUCOSE 118* 09/04/2015 2237   BUN 18 09/04/2015 2237   CREATININE 0.99 09/04/2015 2237   CALCIUM 9.3 09/04/2015 2237   PROT 7.6 08/17/2015 1253   ALBUMIN 4.4 08/17/2015 1253   AST 22 08/17/2015 1253   ALT 26 08/17/2015 1253   ALKPHOS 66 08/17/2015 1253   BILITOT 1.1 08/17/2015 1253   GFRNONAA >60 09/04/2015 2237   GFRAA >60 09/04/2015  2237      CT ABDOMEN AND PELVIS WITH CONTRAST   TECHNIQUE: Multidetector CT imaging of the abdomen and pelvis was performed using the standard protocol following bolus administration of intravenous contrast.   CONTRAST:  126m OMNIPAQUE IOHEXOL 300 MG/ML  SOLN   COMPARISON:  CT 08/17/2015   FINDINGS: The liver, gallbladder, spleen, pancreas, and adrenal glands are normal. The kidneys demonstrate  symmetric enhancement without hydronephrosis or localizing abnormality.   The stomach is physiologically distended. Contrast opacifies normal appearing proximal small bowel loops. There is a small bowel intussusception in the left upper quadrant of the abdomen. More distal small bowel loops are normal in caliber.   Patient is post appendectomy with surgical clips at the bases cecum. There is no adjacent inflammation, fluid collection or abscess. Diffuse colonic wall thickening spanning from the cecum through the sigmoid colon. Minimal adjacent pericolonic inflammatory change. No ascites or free air.   No retroperitoneal adenopathy. Abdominal aorta is normal in caliber.   Within the pelvis the bladder is physiologically distended. No bladder wall thickening. Minimal fat in the left inguinal canal. Prostate gland is normal.   There are no acute or suspicious osseous abnormalities.   IMPRESSION: 1. Post appendectomy without postoperative fluid collection or abscess. 2. Diffuse near pan colitis. This is likely infectious or inflammatory. 3. Small bowel small bowel intussusception in the left upper quadrant of the abdomen. In adults this is most commonly transient and of doubtful clinical significance.     Electronically Signed   By: MJeb LeveringM.D.   On: 09/05/2015 04:51                Vitals       Height Weight BMI (Calculated)      5' 8"  (1.727 m) 176 lb (79.833 kg) 26.8         Interpretation Summary       CLINICAL DATA:  Abdominal cramping and diarrhea. Low-grade fever. Symptoms for 1 day. Post appendectomy 2 weeks prior.   EXAM: CT ABDOMEN AND PELVIS WITH CONTRAST   TECHNIQUE: Multidetector CT imaging of the abdomen and pelvis was performed using the standard protocol following bolus administration of intravenous contrast.   CONTRAST:  1027mOMNIPAQUE IOHEXOL 300 MG/ML  SOLN   COMPARISON:  CT 08/17/2015   FINDINGS: The liver, gallbladder, spleen,  pancreas, and adrenal glands are normal. The kidneys demonstrate symmetric enhancement without hydronephrosis or localizing abnormality.   The stomach is physiologically distended. Contrast opacifies normal appearing proximal small bowel loops. There is a small bowel intussusception in the left upper quadrant of the abdomen. More distal small bowel loops are normal in caliber.   Patient is post appendectomy with surgical clips at the bases cecum. There is no adjacent inflammation, fluid collection or abscess. Diffuse colonic wall thickening spanning from the cecum through the sigmoid colon. Minimal adjacent pericolonic inflammatory change. No ascites or free air.   No retroperitoneal adenopathy. Abdominal aorta is normal in caliber.   Within the pelvis the bladder is physiologically distended. No bladder wall thickening. Minimal fat in the left inguinal canal. Prostate gland is normal.   There are no acute or suspicious osseous abnormalities.   IMPRESSION: 1. Post appendectomy without postoperative fluid collection or abscess. 2. Diffuse near pan colitis. This is likely infectious or inflammatory. 3. Small bowel small bowel intussusception in the left upper quadrant of the abdomen. In adults this is most commonly transient and of doubtful clinical significance.     Electronically  Signed   By: Jeb Levering M.D.   On: 09/05/2015 04:51     CT ABDOMEN AND PELVIS WITH CONTRAST   TECHNIQUE: Multidetector CT imaging of the abdomen and pelvis was performed using the standard protocol following bolus administration of intravenous contrast.   CONTRAST:  129m OMNIPAQUE IOHEXOL 300 MG/ML SOLN, 257mOMNIPAQUE IOHEXOL 300 MG/ML SOLN   COMPARISON:  None.   FINDINGS: Appendix: Distended, measuring 1 cm in diameter with surrounding inflammatory change. Appendix curls along the medial margin of the cecum and lower ascending colon. No extraluminal air or evidence of an  abscess.   Gastrointestinal:  Stomach, colon and small bowel are unremarkable.   Lung bases:  Essentially clear.  Heart normal size.   Liver, spleen, gallbladder, pancreas, adrenal glands:  Normal.   Kidneys, ureters, bladder:  Normal.   Lymph nodes:  No enlarged lymph nodes.   Ascites: None.   Musculoskeletal:  Unremarkable.   IMPRESSION: 1. Acute appendicitis. No evidence of appendiceal rupture or of a periappendiceal abscess. 2. No other abnormalities.     Electronically Signed   By: DaLajean Manes.D.   On: 08/17/2015 14:44     Assessment & Plan:  [age]ear old male with a history of left-sided ulcerative colitis diagnosed in 2010 maintained previously on Lialda at times Rowasa enemas who is seen as an urgent work in with recent symptoms of colitis flare.   1. UC, active colitis -- my initial impression is a flare of chronic ulcerative colitis though given improvement with 24 hours of antibiotics this could be infectious colitis. Given improvement will continue Lialda 2.4 g twice a day and have him continue antibiotics with ciprofloxacin and metronidazole. Await GI pathogen panel. If negative I expect he will need either prednisone or colonic release budesonide (Uceris).  Follow-up GI pathogen panel, and then follow-up with Dr. PeHenrene PastorHe is asked to call if he develops worsening abdominal pain, fever, worsening of diarrhea or bloody stools. He voices understanding

## 2015-09-06 NOTE — Patient Instructions (Signed)
We have sent the following medications to your pharmacy for you to pick up at your convenience: Lialda 4.8 g daily  Please continue your Lialda and antibiotics.  We will be in touch with you regarding your GI pathogen panel (stool study) results

## 2015-09-07 LAB — GI PATHOGEN PANEL BY PCR, STOOL
C DIFFICILE TOXIN A/B: NOT DETECTED
CAMPYLOBACTER BY PCR: NOT DETECTED
Cryptosporidium by PCR: NOT DETECTED
E COLI (ETEC) LT/ST: NOT DETECTED
E COLI 0157 BY PCR: NOT DETECTED
E coli (STEC): NOT DETECTED
G LAMBLIA BY PCR: NOT DETECTED
Norovirus GI/GII: NOT DETECTED
Rotavirus A by PCR: NOT DETECTED
Salmonella by PCR: NOT DETECTED
Shigella by PCR: NOT DETECTED

## 2015-09-09 ENCOUNTER — Telehealth: Payer: Self-pay | Admitting: *Deleted

## 2015-09-09 MED ORDER — BUDESONIDE 9 MG PO TB24: 1.0000 | ORAL_TABLET | Freq: Every day | ORAL | Status: DC

## 2015-09-09 NOTE — Telephone Encounter (Signed)
I have spoken to patient to advise of negative GI pathogen panel. Also advised to continue Lialda 4.8 g daily and start Uceris 1 tablet daily x 6 weeks (rx sent to pharmacy). Patient verbalizes understanding and will come for follow up with Dr Henrene Pastor on 10/24/15 2@ 3:15pm. He will call our office if he continues to have problems in the mean time.

## 2015-09-09 NOTE — Telephone Encounter (Signed)
-----   Message from Larina Bras, [place] sent at 09/09/2015 10:04 AM EDT ----- ===View-only below this line===  ----- Message -----    From: Jerene Bears, MD    Sent: 09/09/2015   9:08 AM      To: Larina Bras, CMA  Ok, negative infection panel I would recommend Uceris 1 tab daily x 6 weeks Office followup with Dr. Henrene Pastor 5 days of abx should be sufficient  Continue his lialda 4.8 g daily JMP

## 2015-09-13 ENCOUNTER — Ambulatory Visit: Payer: Managed Care, Other (non HMO) | Admitting: Physician Assistant

## 2015-10-24 ENCOUNTER — Encounter: Payer: Self-pay | Admitting: Internal Medicine

## 2015-10-24 ENCOUNTER — Ambulatory Visit (INDEPENDENT_AMBULATORY_CARE_PROVIDER_SITE_OTHER): Payer: Managed Care, Other (non HMO) | Admitting: Internal Medicine

## 2015-10-24 ENCOUNTER — Ambulatory Visit: Payer: Managed Care, Other (non HMO) | Admitting: Internal Medicine

## 2015-10-24 VITALS — BP 100/66 | HR 68 | Ht 68.0 in | Wt 180.4 lb

## 2015-10-24 DIAGNOSIS — R933 Abnormal findings on diagnostic imaging of other parts of digestive tract: Secondary | ICD-10-CM

## 2015-10-24 DIAGNOSIS — K5100 Ulcerative (chronic) pancolitis without complications: Secondary | ICD-10-CM | POA: Diagnosis not present

## 2015-10-24 MED ORDER — MESALAMINE 1.2 G PO TBEC: 2.4000 g | DELAYED_RELEASE_TABLET | Freq: Two times a day (BID) | ORAL | Status: DC

## 2015-10-24 NOTE — Patient Instructions (Signed)
We have sent the following medications to your pharmacy for you to pick up at your convenience: Lialda   Please call to make a follow up appointment with Dr. Henrene Pastor in one year or sooner if needed.

## 2015-10-24 NOTE — Progress Notes (Signed)
HISTORY OF PRESENT ILLNESS:  George Morrison is a 34 y.o. male with chronic left-sided ulcerative colitis diagnosed in August 2010 (colonoscopy with biopsies). He was subsequently treated with Lialda 4.8 g daily and Rowasa enemas. He last underwent colonoscopy in February 2015 because of rectal bleeding. At that time the colonic mucosa was normal as was the ileum. He was to continue on mesalamine and follow-up in 1 year. In August 2016 he had acute appendicitis and underwent appendectomy with Dr. gross. 2 weeks later he presented to the hospital with cramping abdominal pain, diarrhea, and low-grade fever. CT scan of the abdomen and pelvis 09/04/2015 revealed near diffuse pancolitis. He was evaluated in the office by my partner Dr. Hilarie Fredrickson 09/06/2015. CBC and comprehensive metabolic panel from 2 days previous was unremarkable. GI pathogen panel was obtained and returned negative. He had been placed on ciprofloxacin and metronidazole by the emergency room. In addition to continuing Lialda, he was placed on cortisone enemas for one week. Symptoms resolved. He presents today for follow-up as requested. Patient is pleased report that he is back to baseline. Normal bowel movements without pain or bleeding. No new complaints. He is currently working at [employer] and has a 32-year-old son  REVIEW OF SYSTEMS:  All non-GI ROS negative upon comprehensive review  Past Medical History  Diagnosis Date  . Anal fissure   . Ulcerative colitis   . Appendicitis     Past Surgical History  Procedure Laterality Date  . Ganglion cyst excision      left hand  . Laparoscopic appendectomy N/A 08/17/2015    Procedure: APPENDECTOMY LAPAROSCOPIC;  Surgeon: Michael Boston, MD;  Location: [facility];  Service: General;  Laterality: N/A;  . Appendectomy      Social History [name]  reports that he has never smoked. He has never used smokeless tobacco. He reports that he does not drink alcohol or use illicit  drugs.  family history includes Cancer in his other; Cirrhosis in his other; Hepatitis in his other; Liver disease in his other. There is no history of Colon cancer, Esophageal cancer, Rectal cancer, or Stomach cancer.  Allergies  Allergen Reactions  . Codeine Rash    dizzy        PHYSICAL EXAMINATION: Vital signs: BP 100/66 mmHg  Pulse 68  Ht 5' 8"  (1.727 m)  Wt 180 lb 6 oz (81.818 kg)  BMI 27.43 kg/m2 General: Well-developed, well-nourished, no acute distress HEENT: Sclerae are anicteric, conjunctiva pink. Oral mucosa intact Lungs: Clear Heart: Regular Abdomen: soft, nontender, nondistended, no obvious ascites, no peritoneal signs, normal bowel sounds. No organomegaly. Extremities: No clubbing cyanosis or edema Psychiatric: alert and oriented x3. Cooperative   ASSESSMENT:  #1. Recent acute colitis. Based on clinical presentation, CT distribution, colonoscopy your previous, and quick resolution, suspect infectious enteritis. #2. History of left-sided ulcerative colitis. No symptoms currently   PLAN:  #1. Continue Lialda. Medication refilled for one year #2. Routine GI follow-up 1 year. Contact the office in the interim if needed.

## 2015-11-13 ENCOUNTER — Encounter: Payer: Self-pay | Admitting: Family Medicine

## 2015-11-13 ENCOUNTER — Ambulatory Visit (INDEPENDENT_AMBULATORY_CARE_PROVIDER_SITE_OTHER): Payer: Managed Care, Other (non HMO) | Admitting: Family Medicine

## 2015-11-13 VITALS — BP 104/68 | HR 66 | Wt 184.0 lb

## 2015-11-13 DIAGNOSIS — M205X1 Other deformities of toe(s) (acquired), right foot: Secondary | ICD-10-CM | POA: Diagnosis not present

## 2015-11-13 DIAGNOSIS — M216X9 Other acquired deformities of unspecified foot: Secondary | ICD-10-CM | POA: Diagnosis not present

## 2015-11-13 DIAGNOSIS — M205X9 Other deformities of toe(s) (acquired), unspecified foot: Secondary | ICD-10-CM | POA: Insufficient documentation

## 2015-11-13 NOTE — Patient Instructions (Signed)
Good to see you Ice is your friend Possible ice bath at night for 10 minutes at night Spenco orthotics "total support" at night Vitamin D 2000 IU daily Turmeric 588m  Daily Good shoes with rigid bottom.  KJalene Mullet Merrell or New balance greater then 700 Exercises 3 times a week.   See me again in 4 weeks and if not better we may consider custom orthotics Happy holidays!

## 2015-11-13 NOTE — Assessment & Plan Note (Signed)
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 We discussed over-the-counter orthotics, exercises to avoid progression, we discussed possible between the first and second toe could help with alignment. We discussed proper shoewear. Patient will try to make these changes and come back in 3-4 weeks. If worsening symptoms patient did need custom orthotics in the long run.

## 2015-11-13 NOTE — Progress Notes (Signed)
[facility] [place], [place] [address] Phone: [phone] Subjective:    I'm seeing this patient by the request  of:  George Calico, MD   CC: Bilateral foot pain  HER:[id number] [name] is a 34 y.o. male coming in with complaint of bilateral foot pain. Had this pain for quite some time. Has seen another provider for his foot pain but did not have any significant benefit. Patient states that he has had it for years and does not remember any true injury. Finds it difficult to find shoes. Has a lot of pain over the first and second toes especially of the right foot. Feels that his right second toes seems to overlap his big toe. Patient was wearing all shoes and was having severe pain at work. His change shoes recently over the course last week and has been feeling somewhat better. Patient denies any numbness. States though that his feet seems to stay cold. Rates the severity of pain a 5 out of 10. Patient is just does not want to have them get any worse.  Past Medical History  Diagnosis Date  . Anal fissure   . Ulcerative colitis   . Appendicitis    Past Surgical History  Procedure Laterality Date  . Ganglion cyst excision      left hand  . Laparoscopic appendectomy N/A 08/17/2015    Procedure: APPENDECTOMY LAPAROSCOPIC;  Surgeon: Michael Boston, MD;  Location: [facility];  Service: General;  Laterality: N/A;  . Appendectomy     Social History  Substance Use Topics  . Smoking status: Never Smoker   . Smokeless tobacco: Never Used  . Alcohol Use: No     Comment: very occasional   Allergies  Allergen Reactions  . Codeine Rash    dizzy    Family History  Problem Relation Age of Onset  . Liver disease Other   . Cancer Other     Breast cancer/aunt  . Cirrhosis Other     aunt died at 60  . Hepatitis Other     viral hepatitis  . Colon cancer Neg Hx   . Esophageal cancer Neg Hx   . Rectal cancer Neg Hx   . Stomach cancer Neg Hx          Past medical history, social, surgical and family history all reviewed in electronic medical record.   Review of Systems: No headache, visual changes, nausea, vomiting, diarrhea, constipation, dizziness, abdominal pain, skin rash, fevers, chills, night sweats, weight loss, swollen lymph nodes, body aches, joint swelling, muscle aches, chest pain, shortness of breath, mood changes.   Objective Blood pressure 104/68, pulse 66, weight 184 lb (83.462 kg), SpO2 97 %.  General: No apparent distress alert and oriented x3 mood and affect normal, dressed appropriately.  HEENT: Pupils equal, extraocular movements intact  Respiratory: Patient's speak in full sentences and does not appear short of breath  Cardiovascular: No lower extremity edema, non tender, no erythema  Skin: Warm dry intact with no signs of infection or rash on extremities or on axial skeleton.  Abdomen: Soft nontender  Neuro: Cranial nerves II through XII are intact, neurovascularly intact in all extremities with 2+ DTRs and 2+ pulses.  Lymph: No lymphadenopathy of posterior or anterior cervical chain or axillae bilaterally.  Gait normal with good balance and coordination.  MSK:  Non tender with full range of motion and good stability and symmetric strength and tone of shoulders, elbows, wrist, hip, knee  and ankles bilaterally.  Foot exam shows. Patient does have a severe narrow foot. Patient also has some very minimal overpronation of the midfoot. Patient does have a dynamic arch of the longitudinal arch but does have collection of the transverse arch bilaterally right greater than left. Patient does have a Morton's toe bilaterally. Mild fibular deviation of the first toe on the right foot. Mild all extremities of the right foot neurovascularly intact. Full range of motion of the ankles.   Impression and Recommendations:     This case required medical decision making of moderate complexity.

## 2015-11-13 NOTE — Progress Notes (Signed)
Pre visit review using our clinic review tool, if applicable. No additional management support is needed unless otherwise documented below in the visit note. 

## 2015-11-27 ENCOUNTER — Encounter: Payer: Self-pay | Admitting: Internal Medicine

## 2016-07-21 ENCOUNTER — Telehealth: Payer: Self-pay | Admitting: Internal Medicine

## 2016-07-21 NOTE — Telephone Encounter (Signed)
Pt was switched from 1st shift to 3rd shift 49mh ago. States he has problems with insomnia and the added stress from sleep deprivation is causing him to have more flares of his UC. States it is becoming more difficult to stay on top of his symptoms. Pt states he can be switched from 3rd shift to 1st shift if he has a letter from his doctor stating that he has a medical reason to do this. Pt requesting a letter from Dr. PHenrene Pastor Please advise.

## 2016-07-21 NOTE — Telephone Encounter (Signed)
George Morrison, please provide letter & sign on my behalf. Thank you

## 2016-07-22 NOTE — Telephone Encounter (Signed)
Letter mailed to pt.  

## 2016-08-09 ENCOUNTER — Emergency Department (HOSPITAL_COMMUNITY): Payer: Managed Care, Other (non HMO)

## 2016-08-09 ENCOUNTER — Emergency Department (HOSPITAL_COMMUNITY)
Admission: EM | Admit: 2016-08-09 | Discharge: 2016-08-09 | Disposition: A | Discharge status: Home / Self Care | Payer: Managed Care, Other (non HMO) | Attending: Emergency Medicine | Admitting: Emergency Medicine

## 2016-08-09 ENCOUNTER — Encounter (HOSPITAL_COMMUNITY): Payer: Self-pay | Admitting: Emergency Medicine

## 2016-08-09 DIAGNOSIS — S4992XA Unspecified injury of left shoulder and upper arm, initial encounter: Secondary | ICD-10-CM | POA: Diagnosis present

## 2016-08-09 DIAGNOSIS — Y929 Unspecified place or not applicable: Secondary | ICD-10-CM | POA: Diagnosis not present

## 2016-08-09 DIAGNOSIS — Y999 Unspecified external cause status: Secondary | ICD-10-CM | POA: Insufficient documentation

## 2016-08-09 DIAGNOSIS — S43005A Unspecified dislocation of left shoulder joint, initial encounter: Secondary | ICD-10-CM | POA: Diagnosis not present

## 2016-08-09 DIAGNOSIS — Y9366 Activity, soccer: Secondary | ICD-10-CM | POA: Insufficient documentation

## 2016-08-09 DIAGNOSIS — X509XXA Other and unspecified overexertion or strenuous movements or postures, initial encounter: Secondary | ICD-10-CM | POA: Diagnosis not present

## 2016-08-09 MED ORDER — LIDOCAINE HCL 2 % IJ SOLN
10.0000 mL | Freq: Once | INTRAMUSCULAR | Status: AC
  Administered 2016-08-09: 200 mg
  Filled 2016-08-09: qty 20

## 2016-08-09 MED ORDER — DIAZEPAM 5 MG/ML IJ SOLN
5.0000 mg | Freq: Once | INTRAMUSCULAR | Status: AC
  Administered 2016-08-09: 5 mg via INTRAVENOUS
  Filled 2016-08-09: qty 2

## 2016-08-09 MED ORDER — HYDROMORPHONE HCL 1 MG/ML IJ SOLN
1.0000 mg | Freq: Once | INTRAMUSCULAR | Status: AC
  Administered 2016-08-09: 1 mg via INTRAVENOUS
  Filled 2016-08-09: qty 1

## 2016-08-09 MED ORDER — HYDROCODONE-ACETAMINOPHEN 5-325 MG PO TABS: 1.0000 | ORAL_TABLET | Freq: Four times a day (QID) | ORAL | 0 refills | Status: DC | PRN

## 2016-08-09 NOTE — ED Notes (Signed)
MD at bedside. 

## 2016-08-09 NOTE — ED Notes (Signed)
Discharge instructions, follow up care, and rx x1 reviewed with patient. Patient verbalized understanding. Patient made aware it is not advised that he drives after the medications he was administered today. Wife at bedside to drive patient home.

## 2016-08-09 NOTE — ED Notes (Signed)
X-ray at bedside

## 2016-08-09 NOTE — Discharge Instructions (Signed)
Take motrin for pain.   Take vicodin for severe pain. Do NOT drive.   Keep your shoulder immobilizer on until you see ortho.   Call Dr. Nona Dell office for appointment   Return to ER if you have another dislocation, severe shoulder pain.

## 2016-08-09 NOTE — ED Provider Notes (Signed)
[facility] DEPT Provider Note   CSN: [id number] Arrival date & time: 08/09/16  1059     History   Chief Complaint Chief Complaint  Patient presents with  . Shoulder Injury    HPI George Morrison is a 35 y.o. male history of ulcerative colitis, previous right shoulder dislocation here presenting with possible left shoulder dislocation. He was playing soccer this morning and fell on outstretched hand and felt a pop and noticed the left shoulder is deformed. Denies any head injury or loss of consciousness or syncope. He is otherwise healthy and had previous right shoulder dislocation the past. Last meal was last night.    The history is provided by the patient.    Past Medical History:  Diagnosis Date  . Anal fissure   . Appendicitis   . Ulcerative colitis     Patient Active Problem List   Diagnosis Date Noted  . 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 Hallux limitus 11/13/2015  . Appendicitis with peritonitis s/p lap appy 08/17/2015 08/17/2015  . Ganglion cyst of wrist 06/08/2011  . ULCERATIVE COLITIS-LEFT SIDE 08/30/2009    Past Surgical History:  Procedure Laterality Date  . APPENDECTOMY    . GANGLION CYST EXCISION     left hand  . LAPAROSCOPIC APPENDECTOMY N/A 08/17/2015   Procedure: APPENDECTOMY LAPAROSCOPIC;  Surgeon: Michael Boston, MD;  Location: [facility];  Service: General;  Laterality: N/A;       Home Medications    Prior to Admission medications   Medication Sig Start Date End Date Taking? Authorizing Provider  acetaminophen (TYLENOL) 500 MG tablet Take 1,000 mg by mouth every 6 (six) hours as needed for mild pain, moderate pain or headache.    Yes Historical Provider, MD  Melatonin 5 MG TABS Take 5 mg by mouth at bedtime as needed (for sleep).   Yes Historical Provider, MD  mesalamine (LIALDA) 1.2 G EC tablet Take 2 tablets (2.4 g total) by mouth 2 (two) times daily. 10/24/15  Yes Irene Shipper, MD    Family History Family History  Problem  Relation Age of Onset  . Liver disease Other   . Cancer Other     Breast cancer/aunt  . Cirrhosis Other     aunt died at 7  . Hepatitis Other     viral hepatitis  . Colon cancer Neg Hx   . Esophageal cancer Neg Hx   . Rectal cancer Neg Hx   . Stomach cancer Neg Hx     Social History Social History  Substance Use Topics  . Smoking status: Never Smoker  . Smokeless tobacco: Never Used  . Alcohol use No     Comment: very occasional     Allergies   Codeine   Review of Systems Review of Systems  Musculoskeletal:       L shoulder pain   All other systems reviewed and are negative.    Physical Exam Updated Vital Signs BP 115/79   Pulse 72   Temp 97.6 F (36.4 C) (Oral)   Resp 18   Ht 5' 7"  (1.702 m)   Wt 184 lb (83.5 kg)   SpO2 97%   BMI 28.82 kg/m   Physical Exam  Constitutional:  Uncomfortable   HENT:  Head: Normocephalic and atraumatic.  Eyes: EOM are normal. Pupils are equal, round, and reactive to light.  Neck: Normal range of motion. Neck supple.  Cardiovascular: Normal rate, regular rhythm and normal heart sounds.   Pulmonary/Chest: Effort normal  and breath sounds normal. No respiratory distress. He has no wheezes. He has no rales.  Abdominal: Soft. Bowel sounds are normal. He exhibits no distension. There is no tenderness. There is no guarding.  Musculoskeletal:  L shoulder appears anteriorly dislocated. Able to finger grasp. No elbow or forearm tenderness. 2+ pulses.   Neurological: He is alert.  Skin: Skin is warm.  Psychiatric: He has a normal mood and affect.  Nursing note and vitals reviewed.    ED Treatments / Results  Labs (all labs ordered are listed, but only abnormal results are displayed) Labs Reviewed - No data to display  EKG  EKG Interpretation None       Radiology Dg Shoulder Left  Result Date: 08/09/2016 CLINICAL DATA:  Pain following fall EXAM: LEFT SHOULDER - 2+ VIEW COMPARISON:  October 23, 2012 FINDINGS: Frontal  and Y scapular images were obtained. There is a subcoracoid anterior dislocation. No fracture evident. No evidence of arthropathy. Visualized left lung is clear. IMPRESSION: Subcoracoid anterior dislocation on the left.  No fracture evident. Electronically Signed   By: Lowella Grip III M.D.   On: 08/09/2016 12:01   Dg Shoulder Left Port  Result Date: 08/09/2016 CLINICAL DATA:  Shoulder reduction. EXAM: LEFT SHOULDER - 1 VIEW COMPARISON:  Earlier same day. FINDINGS: Humeral head now properly located in the glenoid. Mild flattening of the posterior aspect of the humeral head could represent Hill-Sachs injury. No evidence of scapular fracture or AC joint injury. IMPRESSION: Relocated. Question flattening of the posterior aspect of the humeral head. Electronically Signed   By: Nelson Chimes M.D.   On: 08/09/2016 12:52    Procedures Procedures (including critical care time)  Reduction of dislocation Date/Time: 1:05 PM Performed by: Wandra Arthurs Authorized by: Wandra Arthurs Consent: Verbal consent obtained. Risks and benefits: risks, benefits and alternatives were discussed Consent given by: patient Required items: required blood products, implants, devices, and special equipment available Time out: Immediately prior to procedure a "time out" was called to verify the correct patient, procedure, equipment, support staff and site/side marked as required.  Patient sedated: no, given valium, dilaudid, hematoma block performed   Vitals: Vital signs were monitored during sedation. Patient tolerance: Patient tolerated the procedure well with no immediate complications. Joint: L shoulder Reduction technique: external rotation, traction     Medications Ordered in ED Medications  HYDROmorphone (DILAUDID) injection 1 mg (1 mg Intravenous Given 08/09/16 1123)  diazepam (VALIUM) injection 5 mg (5 mg Intravenous Given 08/09/16 1123)  lidocaine (XYLOCAINE) 2 % (with pres) injection 200 mg (200 mg Other  Given by Other 08/09/16 1124)     Initial Impression / Assessment and Plan / ED Course  I have reviewed the triage vital signs and the nursing notes.  Pertinent labs & imaging results that were available during my care of the patient were reviewed by me and considered in my medical decision making (see chart for details).  Clinical Course   Dorman Harish Bram is a 35 y.o. male here with L shoulder injury, concerned for possible anterior dislocation. Has previous R shoulder dislocation. Will get xrays and give pain meds and try reduction if there is no fracture.   1:05 PM I performed shoulder reduction after giving him ativan, valium, hematoma block with lidocaine. Shoulder reduced, confirmed on xray. Patient placed in shoulder immobilizer. Will have him see ortho outpatient.     Final Clinical Impressions(s) / ED Diagnoses   Final diagnoses:  None    New  Prescriptions New Prescriptions   No medications on file     Drenda Freeze, MD 08/09/16 1306

## 2016-08-09 NOTE — ED Triage Notes (Signed)
Patient reports falling on left side while playing soccer. Patient is complaining of left shoulder pain; deformity noted. Patient had a right dislocated shoulder previously. Patient alert and oriented.

## 2016-09-13 IMAGING — DX DG SHOULDER 1V*L*
3 series · 3 of 3 positions shown · non-contrast
Comparison: Earlier same day.

CLINICAL DATA: Shoulder reduction.

EXAM:
LEFT SHOULDER - 1 VIEW

[shoulder ap]
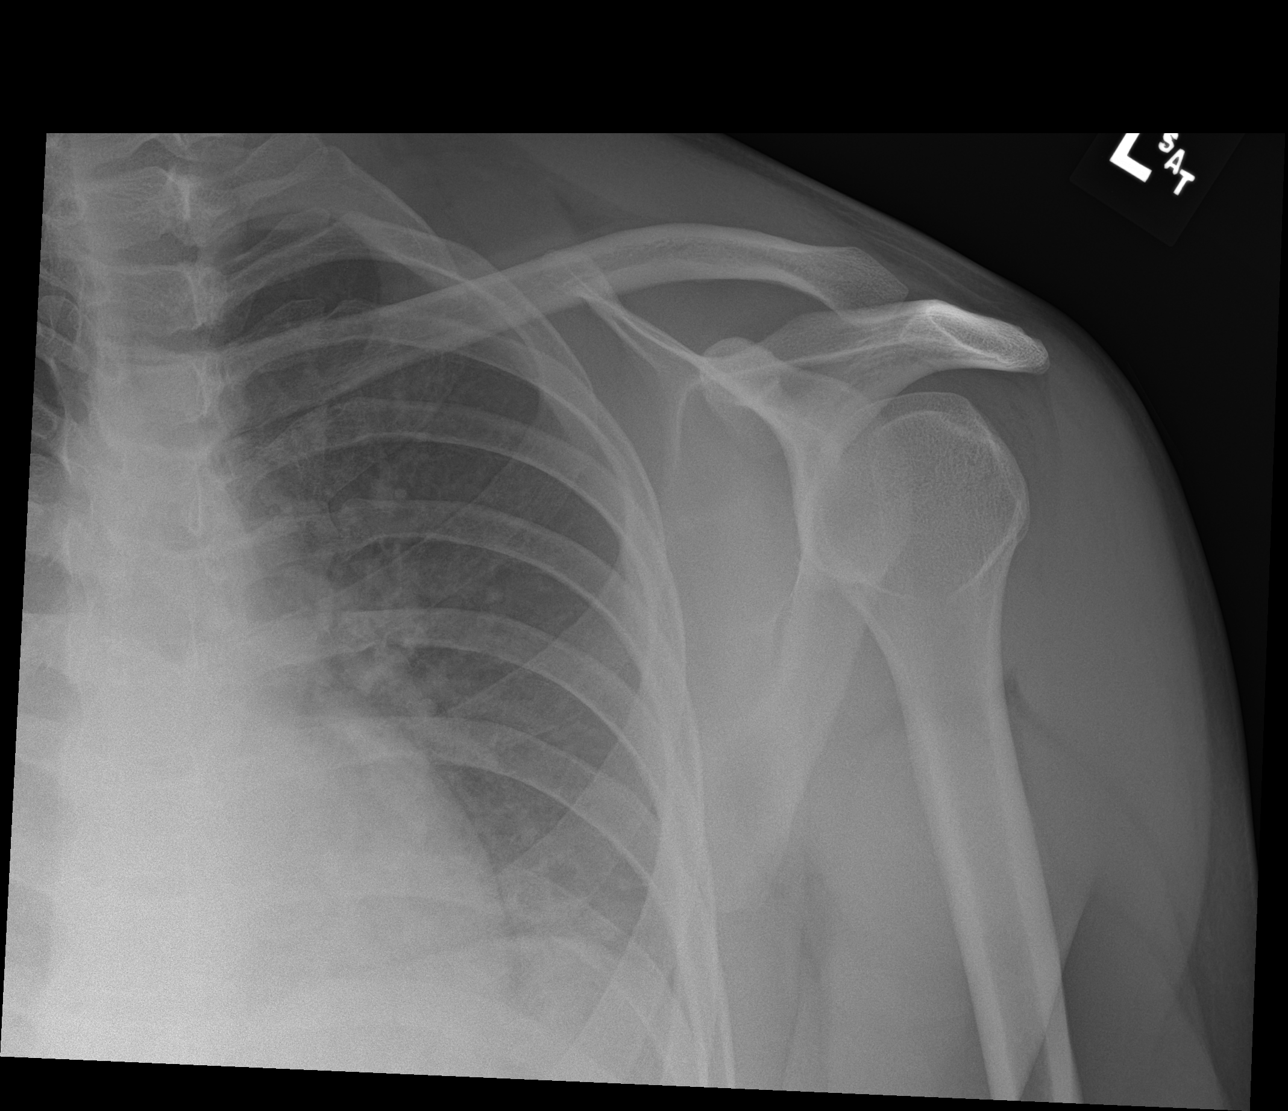

[shoulder obl]
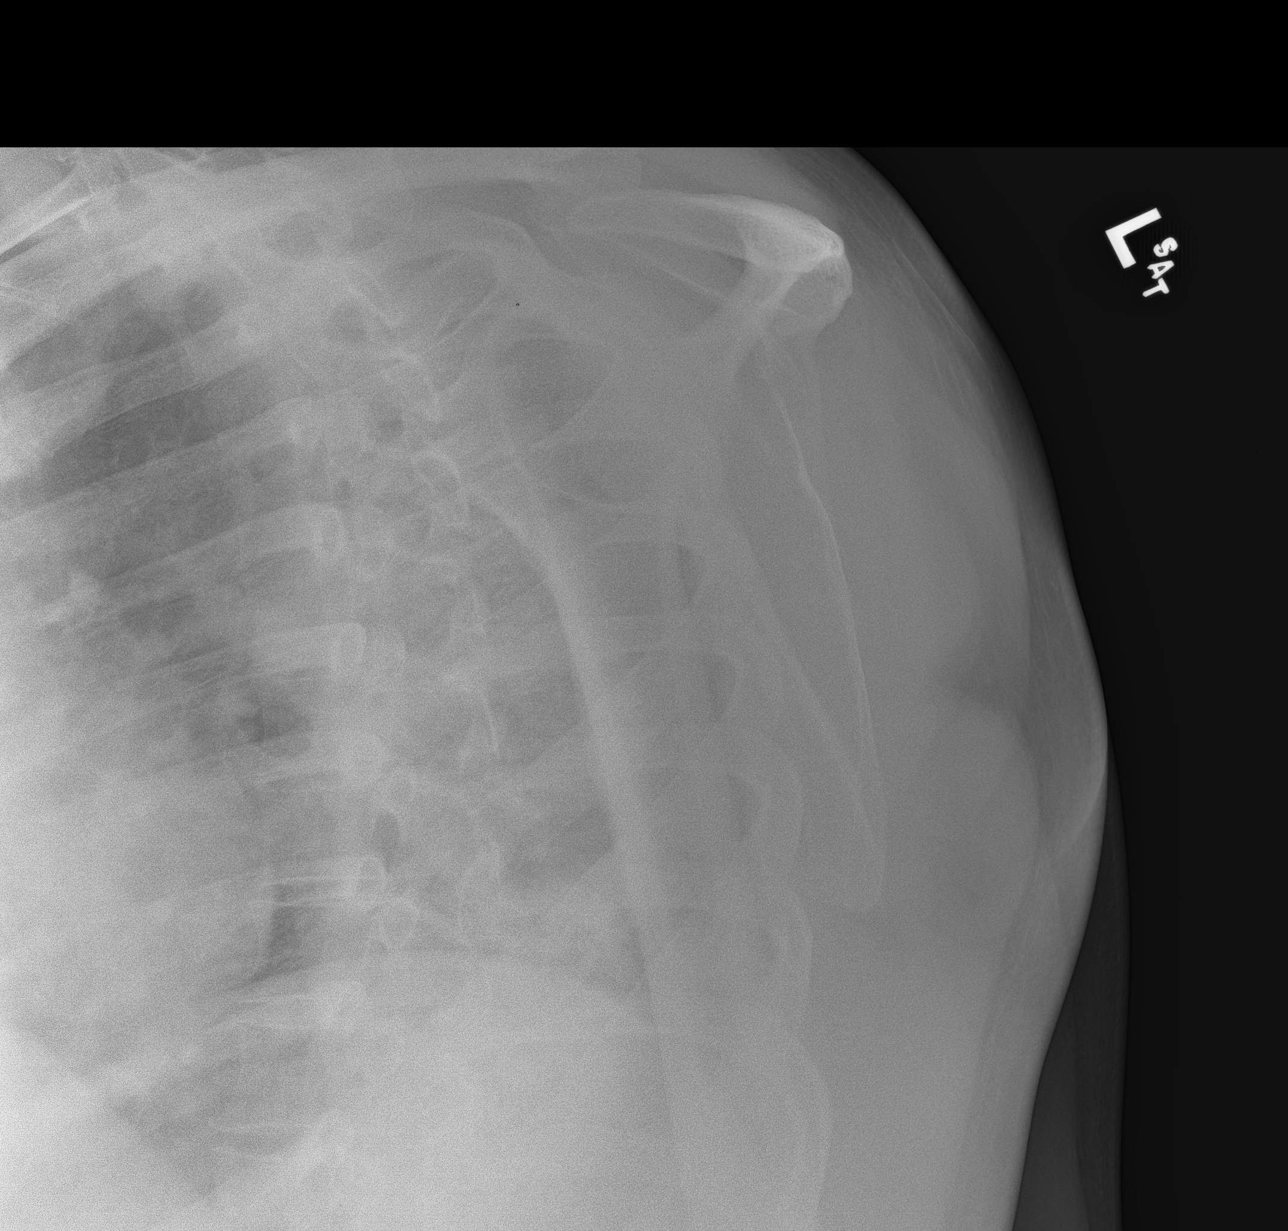

[shoulder y-view]
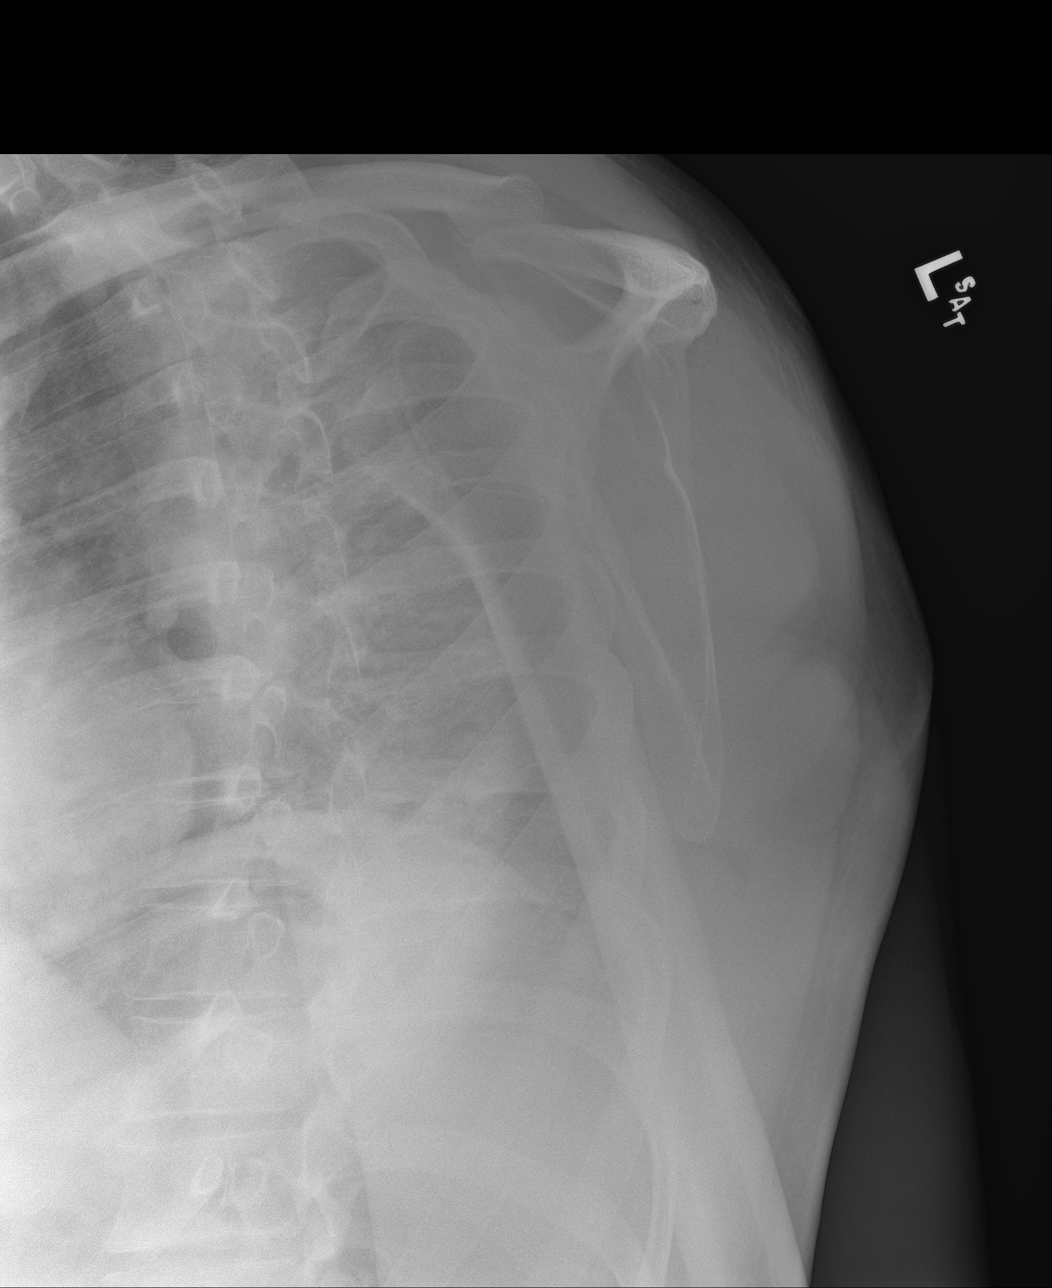

[3 of 3 positions shown; findings below may reference images not displayed]

FINDINGS: Humeral head now properly located in the glenoid. Mild flattening of
the posterior aspect of the humeral head could represent Hill-Sachs
injury. No evidence of scapular fracture or AC joint injury.
IMPRESSION: Relocated. Question flattening of the posterior aspect of the
humeral head.

## 2017-04-02 ENCOUNTER — Telehealth: Payer: Self-pay | Admitting: Internal Medicine

## 2017-04-02 ENCOUNTER — Other Ambulatory Visit: Payer: Self-pay | Admitting: Internal Medicine

## 2017-04-02 NOTE — Telephone Encounter (Signed)
Refill of Lialda already sent to [facility]

## 2017-05-05 ENCOUNTER — Encounter (INDEPENDENT_AMBULATORY_CARE_PROVIDER_SITE_OTHER): Payer: Self-pay

## 2017-05-05 ENCOUNTER — Encounter: Payer: Self-pay | Admitting: Internal Medicine

## 2017-05-05 ENCOUNTER — Ambulatory Visit (INDEPENDENT_AMBULATORY_CARE_PROVIDER_SITE_OTHER): Payer: Commercial Managed Care - PPO | Admitting: Internal Medicine

## 2017-05-05 VITALS — BP 108/76 | HR 62 | Ht 67.0 in | Wt 185.0 lb

## 2017-05-05 DIAGNOSIS — K5180 Other ulcerative colitis without complications: Secondary | ICD-10-CM | POA: Diagnosis not present

## 2017-05-05 DIAGNOSIS — K625 Hemorrhage of anus and rectum: Secondary | ICD-10-CM

## 2017-05-05 MED ORDER — MESALAMINE 1.2 G PO TBEC: 2.4000 g | DELAYED_RELEASE_TABLET | Freq: Two times a day (BID) | ORAL | 6 refills | Status: DC

## 2017-05-05 MED ORDER — NA SULFATE-K SULFATE-MG SULF 17.5-3.13-1.6 GM/177ML PO SOLN: 1.0000 | Freq: Once | ORAL | 0 refills | Status: AC

## 2017-05-05 NOTE — Progress Notes (Signed)
HISTORY OF PRESENT ILLNESS:  George Morrison is a 36 y.o. male with chronic left-sided ulcerative colitis diagnosed in August 2010 (colonoscopy with biopsies). He was subsequently treated with Lialda 4.8 g daily and Rowasa enemas. He last underwent colonoscopy February 2015 to evaluate rectal bleeding. The colonic and ileal mucosa were normal at that time. He was last seen in the office for routine follow-up November 2016. This after a recent bout of acute colitis felt most likely infectious enteritis. He has continued on Lialda 4.8 g daily. He presents today for follow-up. T complaint is that of rectal bleeding. Patient notices red blood per rectum with most every bowel movement. He has 3 bowel movements per day which are variable in terms of size and firmness. No diarrhea or mucus. No abdominal pain. Weight is been stable. His only other GI complaint is that of bloating and gas. He is concerned about the bleeding and inquires about colonoscopy  REVIEW OF SYSTEMS:  All non-GI ROS negative except for back pain, sleeping problems, fatigue  Past Medical History:  Diagnosis Date  . Anal fissure   . Appendicitis   . Ulcerative colitis     Past Surgical History:  Procedure Laterality Date  . APPENDECTOMY    . GANGLION CYST EXCISION     left hand  . LAPAROSCOPIC APPENDECTOMY N/A 08/17/2015   Procedure: APPENDECTOMY LAPAROSCOPIC;  Surgeon: Michael Boston, MD;  Location: [facility];  Service: General;  Laterality: N/A;    Social History Tiandre Jorma Tassinari  reports that he has never smoked. He has never used smokeless tobacco. He reports that he does not drink alcohol or use drugs.  family history includes Cancer in his other; Cirrhosis in his other; Hepatitis in his other; Liver disease in his other.  Allergies  Allergen Reactions  . Codeine Rash and Other (See Comments)    Reaction:  Dizziness        PHYSICAL EXAMINATION: Vital signs: BP 108/76   Pulse 62   Ht 5' 7"  (1.702 m)   Wt 185 lb  (83.9 kg)   BMI 28.98 kg/m   Constitutional: generally well-appearing, no acute distress Psychiatric: alert and oriented x3, cooperative Eyes: extraocular movements intact, anicteric, conjunctiva pink Mouth: oral pharynx moist, no lesions Neck: supple no lymphadenopathy Cardiovascular: heart regular rate and rhythm, no murmur Lungs: clear to auscultation bilaterally Abdomen: soft, nontender, nondistended, no obvious ascites, no peritoneal signs, normal bowel sounds, no organomegaly Rectal:Deferred until colonoscopy Extremities: no clubbing cyanosis or lower extremity edema bilaterally Skin: no lesions on visible extremities Neuro: No focal deficits. Cranial nerves intact  ASSESSMENT:  #1. Chronic left-sided ulcerative colitis diagnosed 2010. Last colonoscopy 2015 as described. On Lialda #2. Chronic rectal bleeding   PLAN:  #1. Continue Lialda 4.8 g daily #2. Schedule colonoscopy to evaluate rectal bleeding and assess status of ulcerative colitis.The nature of the procedure, as well as the risks, benefits, and alternatives were carefully and thoroughly reviewed with the patient. Ample time for discussion and questions allowed. The patient understood, was satisfied, and agreed to proceed. #3. May need topical therapy if active colitis distally is the cause for bleeding. Further recommendations after his endoscopic evaluation is completed

## 2017-05-05 NOTE — Patient Instructions (Signed)
We have sent the following medications to your pharmacy for you to pick up at your convenience:  [place] have been scheduled for a colonoscopy. Please follow written instructions given to you at your visit today.  Please pick up your prep supplies at the pharmacy within the next 1-3 days. If you use inhalers (even only as needed), please bring them with you on the day of your procedure. Your physician has requested that you go to www.startemmi.com and enter the access code given to you at your visit today. This web site gives a general overview about your procedure. However, you should still follow specific instructions given to you by our office regarding your preparation for the procedure.

## 2017-05-21 ENCOUNTER — Other Ambulatory Visit (HOSPITAL_COMMUNITY): Payer: Self-pay | Admitting: Orthopedic Surgery

## 2017-05-21 ENCOUNTER — Ambulatory Visit (HOSPITAL_COMMUNITY)
Admission: RE | Admit: 2017-05-21 | Discharge: 2017-05-21 | Disposition: A | Discharge status: Home / Self Care | Payer: Commercial Managed Care - PPO | Source: Ambulatory Visit | Attending: Orthopedic Surgery | Admitting: Orthopedic Surgery

## 2017-05-21 DIAGNOSIS — M25562 Pain in left knee: Secondary | ICD-10-CM

## 2017-05-21 DIAGNOSIS — Z01818 Encounter for other preprocedural examination: Secondary | ICD-10-CM | POA: Diagnosis not present

## 2017-07-02 ENCOUNTER — Encounter: Payer: Commercial Managed Care - PPO | Admitting: Internal Medicine

## 2017-07-13 ENCOUNTER — Encounter: Payer: Self-pay | Admitting: Internal Medicine

## 2017-07-23 ENCOUNTER — Encounter: Payer: Self-pay | Admitting: Internal Medicine

## 2017-07-23 ENCOUNTER — Ambulatory Visit (AMBULATORY_SURGERY_CENTER): Payer: Commercial Managed Care - PPO | Admitting: Internal Medicine

## 2017-07-23 VITALS — BP 99/60 | HR 67 | Temp 98.2°F | Resp 14 | Ht 67.0 in | Wt 185.0 lb

## 2017-07-23 DIAGNOSIS — K51511 Left sided colitis with rectal bleeding: Secondary | ICD-10-CM | POA: Diagnosis not present

## 2017-07-23 DIAGNOSIS — K51811 Other ulcerative colitis with rectal bleeding: Secondary | ICD-10-CM

## 2017-07-23 MED ORDER — SODIUM CHLORIDE 0.9 % IV SOLN: 500.0000 mL | INTRAVENOUS | Status: DC

## 2017-07-23 NOTE — Progress Notes (Signed)
No problems noted in the recovery room. maw 

## 2017-07-23 NOTE — Op Note (Signed)
[facility] Patient Name: George Morrison Procedure Date: 07/23/2017 1:48 PM MRN: [id number] Endoscopist: Docia Chuck. Henrene Pastor , MD Age: 36 Referring MD:  Date of Birth: 12-Feb-1981 Gender: Male Account #: [id number] Procedure:                Colonoscopy, the biopsies Indications:              Rectal bleeding. 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 Last                            colonoscopy 2015 patient was in remission on                            medication. Medicines:                Monitored Anesthesia Care Procedure:                Pre-Anesthesia Assessment:                           - Prior to the procedure, a History and Physical                            was performed, and patient medications and                            allergies were reviewed. The patient's tolerance of                            previous anesthesia was also reviewed. The risks                            and benefits of the procedure and the sedation                            options and risks were discussed with the patient.                            All questions were answered, and informed consent                            was obtained. Prior Anticoagulants: The patient has                            taken no previous anticoagulant or antiplatelet                            agents. ASA Grade Assessment: II - A patient with                            mild systemic disease. After reviewing the risks  and benefits, the patient was deemed in                            satisfactory condition to undergo the procedure.                           After obtaining informed consent, the colonoscope                            was passed under direct vision. Throughout the                            procedure, the patient's blood pressure, pulse, and                            oxygen saturations were monitored continuously. The                             Model CF-HQ190L [id number] scope was introduced                            through the anus and advanced to the the cecum,                            identified by appendiceal orifice and ileocecal                            valve. The ileocecal valve, appendiceal orifice,                            and rectum were photographed. The quality of the                            bowel preparation was excellent. The colonoscopy                            was performed without difficulty. The patient                            tolerated the procedure well. The bowel preparation                            used was SUPREP. Scope In: 2:01:51 PM Scope Out: 2:10:23 PM Scope Withdrawal Time: 0 hours 6 minutes 51 seconds  Total Procedure Duration: 0 hours 8 minutes 32 seconds  Findings:                 Patchy very mild inflammation characterized by                            erythema and granularity was found in the                            descending colon, over an area of several  centimeters. Biopsies were taken with a cold                            forceps for histology.                           Internal hemorrhoids were found during retroflexion.                           The exam was otherwise without abnormality on                            direct and retroflexion views. Complications:            No immediate complications. Estimated blood loss:                            None. Estimated Blood Loss:     Estimated blood loss: none. Impression:               - Patchy mild inflammation was found in the                            descending colon secondary to colitis. Biopsied.                           - Internal hemorrhoids.                           - The examination was otherwise normal on direct                            and retroflexion views. No active colitis save                            possibly 1 tiny area as  described. Recommendation:           - Repeat colonoscopy in 5 years for surveillance.                           - Patient has a contact number available for                            emergencies. The signs and symptoms of potential                            delayed complications were discussed with the                            patient. Return to normal activities tomorrow.                            Written discharge instructions were provided to the                            patient.                           -  Resume previous diet.                           - Continue present medications.                           - Await pathology results.                           - Routine office follow-up with Dr. Henrene Pastor in 1 year Docia Chuck. Henrene Pastor, MD 07/23/2017 2:18:13 PM This report has been signed electronically.

## 2017-07-23 NOTE — Progress Notes (Signed)
To recovery, report to Jannifer Franklin, Therapist, sports, [facility]

## 2017-07-23 NOTE — Progress Notes (Signed)
Called to room to assist during endoscopic procedure.  Patient ID and intended procedure confirmed with present staff. Received instructions for my participation in the procedure from the performing physician.  

## 2017-07-23 NOTE — Progress Notes (Signed)
Pt asked if bleeding from his rectum was from the hemorrhoids.  He wanted to know if anything he needs to have any treatment for the hems.  I advised pt we would speak with Dr. Henrene Pastor and see what his recommendations are.  I spoke with Dr. Henrene Pastor and he said internal hems were small.  Per Dr. Henrene Pastor not need for hem banding.  Try fiber supplements.  Recommendation was given to pt.  Pt to call if any further problems. maw

## 2017-07-23 NOTE — Patient Instructions (Signed)
YOU HAD AN ENDOSCOPIC PROCEDURE TODAY AT [facility]:   Refer to the procedure report that was given to you for any specific questions about what was found during the examination.  If the procedure report does not answer your questions, please call your gastroenterologist to clarify.  If you requested that your care partner not be given the details of your procedure findings, then the procedure report has been included in a sealed envelope for you to review at your convenience later.  YOU SHOULD EXPECT: Some feelings of bloating in the abdomen. Passage of more gas than usual.  Walking can help get rid of the air that was put into your GI tract during the procedure and reduce the bloating. If you had a lower endoscopy (such as a colonoscopy or flexible sigmoidoscopy) you may notice spotting of blood in your stool or on the toilet paper. If you underwent a bowel prep for your procedure, you may not have a normal bowel movement for a few days.  Please Note:  You might notice some irritation and congestion in your nose or some drainage.  This is from the oxygen used during your procedure.  There is no need for concern and it should clear up in a day or so.  SYMPTOMS TO REPORT IMMEDIATELY:   Following lower endoscopy (colonoscopy or flexible sigmoidoscopy):  Excessive amounts of blood in the stool  Significant tenderness or worsening of abdominal pains  Swelling of the abdomen that is new, acute  Fever of 100F or higher   For urgent or emergent issues, a gastroenterologist can be reached at any hour by calling [phone].   DIET:  We do recommend a small meal at first, but then you may proceed to your regular diet.  Drink plenty of fluids but you should avoid alcoholic beverages for 24 hours.  ACTIVITY:  You should plan to take it easy for the rest of today and you should NOT DRIVE or use heavy machinery until tomorrow (because of the sedation medicines used during the test).     FOLLOW UP: Our staff will call the number listed on your records the next business day following your procedure to check on you and address any questions or concerns that you may have regarding the information given to you following your procedure. If we do not reach you, we will leave a message.  However, if you are feeling well and you are not experiencing any problems, there is no need to return our call.  We will assume that you have returned to your regular daily activities without incident.  If any biopsies were taken you will be contacted by phone or by letter within the next 1-3 weeks.  Please call us at [phone] if you have not heard about the biopsies in 3 weeks.    SIGNATURES/CONFIDENTIALITY: You and/or your care partner have signed paperwork which will be entered into your electronic medical record.  These signatures attest to the fact that that the information above on your After Visit Summary has been reviewed and is understood.  Full responsibility of the confidentiality of this discharge information lies with you and/or your care-partner.    Handout was given to your care partner on hemorrhoids. You may resume your current medications today. Await biopsy results. Please call if any questions or concerns.

## 2017-07-26 ENCOUNTER — Telehealth: Payer: Self-pay

## 2017-07-26 NOTE — Telephone Encounter (Signed)
  Follow up Call-  Call back number 07/23/2017  Post procedure Call Back phone  # [phone]  Permission to leave phone message Yes  Some recent data might be hidden     Patient questions:  Do you have a fever, pain , or abdominal swelling? No. Pain Score  0 *  Have you tolerated food without any problems? Yes.    Have you been able to return to your normal activities? Yes.    Do you have any questions about your discharge instructions: Diet   No. Medications  No. Follow up visit  No.  Do you have questions or concerns about your Care? No.  Actions: * If pain score is 4 or above: No action needed, pain <4. No problems noted per pt. maw

## 2017-08-02 ENCOUNTER — Encounter: Payer: Self-pay | Admitting: Internal Medicine

## 2017-08-25 ENCOUNTER — Telehealth: Payer: Self-pay | Admitting: General Practice

## 2017-08-25 NOTE — Telephone Encounter (Signed)
This pt's wife Nancy Marus) called asking if you would be willing to see them both as new patients?

## 2017-08-30 NOTE — Telephone Encounter (Signed)
Unfortunately, I'm not able to accept any more new patients at this time.  I'm sorry! Thank you!  

## 2017-08-31 NOTE — Telephone Encounter (Signed)
LM informing pt's wife.

## 2017-11-09 NOTE — Progress Notes (Deleted)
   Subjective:    Patient ID: George Morrison, male    DOB: [birth year], 36 y.o.   MRN: [id number]  HPI Primary Preventative Screenings: Prostate Cancer: STI screening:  Colorectal Cancer: Tobacco use/AAA/Lung/EtOH/Illicit substances:  Cardiac: Weight/Blood sugar/Diet/Exercise: BMI Readings from Last 3 Encounters:  07/23/17 28.98 kg/m  05/05/17 28.98 kg/m  08/09/16 28.82 kg/m   No results found for: HGBA1C OTC/Vit/Supp/Herbal: Dentist/Optho: Immunizations:  Immunization History  Administered Date(s) Administered  . Influenza,inj,Quad PF,6+ Mos 08/18/2015    Chronic Medical Conditions: Ulcerative Colitis:     Review of Systems     Objective:   Physical Exam        Assessment & Plan:  [facility]- Dr. Francee Piccolo 1. Annual physical exam   2. Screening for cardiovascular, respiratory, and genitourinary diseases   3. Routine screening for STI (sexually transmitted infection)   4. Screening for deficiency anemia   5. Screening for thyroid disorder   6. ULCERATIVE COLITIS-LEFT SIDE   7. Need for tetanus, diphtheria, and acellular pertussis (Tdap) vaccine in patient of adolescent age or older     Orders Placed This Encounter  Procedures  . GC/Chlamydia Probe Amp  . Tdap vaccine greater than or equal to 7yo IM  . Comprehensive metabolic panel    Order Specific Question:   Has the patient fasted?    Answer:   Yes  . Lipid panel    Order Specific Question:   Has the patient fasted?    Answer:   Yes  . HIV antibody  . HCV Ab w/Rflx to Verification  . RPR  . CBC with Differential/Platelet  . TSH  . Ferritin  . Interpretation:  . POCT urinalysis dipstick    No orders of the defined types were placed in this encounter.   I personally performed the services described in this documentation, which was scribed in my presence. The recorded information has been reviewed and considered, and addended by me as needed.   Delman Cheadle, M.D.  Primary Care at [facility] [address] [place], [place] [address] [phone] phone [phone] fax  11/13/17 1:17 AM

## 2017-11-10 ENCOUNTER — Other Ambulatory Visit: Payer: Self-pay

## 2017-11-10 ENCOUNTER — Ambulatory Visit (INDEPENDENT_AMBULATORY_CARE_PROVIDER_SITE_OTHER): Payer: Commercial Managed Care - PPO | Admitting: Family Medicine

## 2017-11-10 ENCOUNTER — Encounter: Payer: Self-pay | Admitting: Family Medicine

## 2017-11-10 VITALS — BP 118/72 | HR 72 | Temp 98.1°F | Resp 16 | Ht 67.0 in | Wt 185.2 lb

## 2017-11-10 DIAGNOSIS — Z23 Encounter for immunization: Secondary | ICD-10-CM

## 2017-11-10 DIAGNOSIS — Z1383 Encounter for screening for respiratory disorder NEC: Secondary | ICD-10-CM

## 2017-11-10 DIAGNOSIS — K5150 Left sided colitis without complications: Secondary | ICD-10-CM

## 2017-11-10 DIAGNOSIS — Z136 Encounter for screening for cardiovascular disorders: Secondary | ICD-10-CM

## 2017-11-10 DIAGNOSIS — Z113 Encounter for screening for infections with a predominantly sexual mode of transmission: Secondary | ICD-10-CM

## 2017-11-10 DIAGNOSIS — Z1389 Encounter for screening for other disorder: Secondary | ICD-10-CM

## 2017-11-10 DIAGNOSIS — Z0000 Encounter for general adult medical examination without abnormal findings: Secondary | ICD-10-CM

## 2017-11-10 DIAGNOSIS — Z130 Encounter for screening for diseases of the blood and blood-forming organs and certain disorders involving the immune mechanism: Secondary | ICD-10-CM

## 2017-11-10 DIAGNOSIS — Z1329 Encounter for screening for other suspected endocrine disorder: Secondary | ICD-10-CM

## 2017-11-10 LAB — POCT URINALYSIS DIP (MANUAL ENTRY)
BILIRUBIN UA: NEGATIVE
GLUCOSE UA: NEGATIVE mg/dL
Ketones, POC UA: NEGATIVE mg/dL
Leukocytes, UA: NEGATIVE
Nitrite, UA: NEGATIVE
Protein Ur, POC: NEGATIVE mg/dL
RBC UA: NEGATIVE
Urobilinogen, UA: 0.2 E.U./dL
pH, UA: 5 (ref 5.0–8.0)

## 2017-11-10 NOTE — Patient Instructions (Addendum)
   IF you received an x-ray today, you will receive an invoice from [facility]. Please contact [facility] at [phone] with questions or concerns regarding your invoice.   IF you received labwork today, you will receive an invoice from [facility]. Please contact [facility] at [phone] with questions or concerns regarding your invoice.   Our billing staff will not be able to assist you with questions regarding bills from these companies.  You will be contacted with the lab results as soon as they are available. The fastest way to get your results is to activate your My Chart account. Instructions are located on the last page of this paperwork. If you have not heard from us regarding the results in 2 weeks, please contact this office.      Health Maintenance, Male A healthy lifestyle and preventive care is important for your health and wellness. Ask your health care provider about what schedule of regular examinations is right for you. What should I know about weight and diet? Eat a Healthy Diet  Eat plenty of vegetables, fruits, whole grains, low-fat dairy products, and lean protein.  Do not eat a lot of foods high in solid fats, added sugars, or salt.  Maintain a Healthy Weight Regular exercise can help you achieve or maintain a healthy weight. You should:  Do at least 150 minutes of exercise each week. The exercise should increase your heart rate and make you sweat (moderate-intensity exercise).  Do strength-training exercises at least twice a week.  Watch Your Levels of Cholesterol and Blood Lipids  Have your blood tested for lipids and cholesterol every 5 years starting at 35 years of age. If you are at high risk for heart disease, you should start having your blood tested when you are 35 years old. You may need to have your cholesterol levels checked more often if: ? Your lipid or cholesterol levels are high. ? You are older than 35 years of age. ? You  are at high risk for heart disease.  What should I know about cancer screening? Many types of cancers can be detected early and may often be prevented. Lung Cancer  You should be screened every year for lung cancer if: ? You are a current smoker who has smoked for at least 30 years. ? You are a former smoker who has quit within the past 15 years.  Talk to your health care provider about your screening options, when you should start screening, and how often you should be screened.  Colorectal Cancer  Routine colorectal cancer screening usually begins at 35 years of age and should be repeated every 5-10 years until you are 35 years old. You may need to be screened more often if early forms of precancerous polyps or small growths are found. Your health care provider may recommend screening at an earlier age if you have risk factors for colon cancer.  Your health care provider may recommend using home test kits to check for hidden blood in the stool.  A small camera at the end of a tube can be used to examine your colon (sigmoidoscopy or colonoscopy). This checks for the earliest forms of colorectal cancer.  Prostate and Testicular Cancer  Depending on your age and overall health, your health care provider may do certain tests to screen for prostate and testicular cancer.  Talk to your health care provider about any symptoms or concerns you have about testicular or prostate cancer.  Skin Cancer  Check your skin   from head to toe regularly.  Tell your health care provider about any new moles or changes in moles, especially if: ? There is a change in a mole's size, shape, or color. ? You have a mole that is larger than a pencil eraser.  Always use sunscreen. Apply sunscreen liberally and repeat throughout the day.  Protect yourself by wearing long sleeves, pants, a wide-brimmed hat, and sunglasses when outside.  What should I know about heart disease, diabetes, and high blood  pressure?  If you are 18-39 years of age, have your blood pressure checked every 3-5 years. If you are 40 years of age or older, have your blood pressure checked every year. You should have your blood pressure measured twice-once when you are at a hospital or clinic, and once when you are not at a hospital or clinic. Record the average of the two measurements. To check your blood pressure when you are not at a hospital or clinic, you can use: ? An automated blood pressure machine at a pharmacy. ? A home blood pressure monitor.  Talk to your health care provider about your target blood pressure.  If you are between 45-79 years old, ask your health care provider if you should take aspirin to prevent heart disease.  Have regular diabetes screenings by checking your fasting blood sugar level. ? If you are at a normal weight and have a low risk for diabetes, have this test once every three years after the age of 45. ? If you are overweight and have a high risk for diabetes, consider being tested at a younger age or more often.  A one-time screening for abdominal aortic aneurysm (AAA) by ultrasound is recommended for men aged 65-75 years who are current or former smokers. What should I know about preventing infection? Hepatitis B If you have a higher risk for hepatitis B, you should be screened for this virus. Talk with your health care provider to find out if you are at risk for hepatitis B infection. Hepatitis C Blood testing is recommended for:  Everyone born from 1945 through 1965.  Anyone with known risk factors for hepatitis C.  Sexually Transmitted Diseases (STDs)  You should be screened each year for STDs including gonorrhea and chlamydia if: ? You are sexually active and are younger than 35 years of age. ? You are older than 35 years of age and your health care provider tells you that you are at risk for this type of infection. ? Your sexual activity has changed since you were last  screened and you are at an increased risk for chlamydia or gonorrhea. Ask your health care provider if you are at risk.  Talk with your health care provider about whether you are at high risk of being infected with HIV. Your health care provider may recommend a prescription medicine to help prevent HIV infection.  What else can I do?  Schedule regular health, dental, and eye exams.  Stay current with your vaccines (immunizations).  Do not use any tobacco products, such as cigarettes, chewing tobacco, and e-cigarettes. If you need help quitting, ask your health care provider.  Limit alcohol intake to no more than 2 drinks per day. One drink equals 12 ounces of beer, 5 ounces of wine, or 1 ounces of hard liquor.  Do not use street drugs.  Do not share needles.  Ask your health care provider for help if you need support or information about quitting drugs.  Tell your health care   provider if you often feel depressed.  Tell your health care provider if you have ever been abused or do not feel safe at home. This information is not intended to replace advice given to you by your health care provider. Make sure you discuss any questions you have with your health care provider. Document Released: 06/04/2008 Document Revised: 08/05/2016 Document Reviewed: 09/10/2015 [employer]vier Interactive Patient Education  2018 [employer]  

## 2017-11-10 NOTE — Progress Notes (Signed)
Subjective:  By signing my name below, I, George Morrison, attest that this documentation has been prepared under the direction and in the presence of Delman Cheadle, MD Electronically Signed: Ladene Artist, ED Scribe 11/10/2017 at 10:49 AM.   Patient ID: George Morrison, male    DOB: 1981-04-02, 36 y.o.   MRN: [id number]  Chief Complaint  Patient presents with  . Annual Exam   HPI  George Morrison is a 36 y.o. male who presents to Primary Care at [facility] for an annual exam. Pt is not fasting at this visit. He ate yogurt around 9 AM today.   Primary Preventative Screenings: Prostate Cancer: grandfather has h/o testicular CA Grandmother has h/o breast CA STI screening: in a monogamous relationship with his wife but agrees to screening at this visit  Colorectal Cancer: Tobacco use/AAA/Lung/EtOH/Illicit substances: none Cardiac: EKG done 2 years ago. No family h/o cardiac illness.  Weight/Blood sugar/Diet/Exercise: BMI Readings from Last 3 Encounters:  11/10/17 29.01 kg/m  07/23/17 28.98 kg/m  05/05/17 28.98 kg/m   No results found for: HGBA1C OTC/Vit/Supp/Herbal: Dentist/Optho: does not see dentist regularly Immunizations: unsure of last tetanus but thinks it was more than 10 years ago Immunization History  Administered Date(s) Administered  . Influenza,inj,Quad PF,6+ Mos 08/18/2015   L Knee Pain Pt plays soccer. Followed by Dr. Stann Mainland [facility]. Reports that he had his ACL removed in July but has to have a knee replacement since he is still having knee pain.  Chronic Medical Conditions: Ulcerative Colitis: diagnosed ~8 years ago. Followed by Dr. Henrene Pastor   Past Medical History:  Diagnosis Date  . Anal fissure   . Appendicitis   . Ulcerative colitis    Past Surgical History:  Procedure Laterality Date  . APPENDECTOMY    . GANGLION CYST EXCISION     left hand  . LAPAROSCOPIC APPENDECTOMY N/A 08/17/2015   Procedure: APPENDECTOMY LAPAROSCOPIC;  Surgeon: Michael Boston, MD;  Location: [facility];  Service: General;  Laterality: N/A;   Current Outpatient Medications on File Prior to Visit  Medication Sig Dispense Refill  . acetaminophen (TYLENOL) 500 MG tablet Take 1,000 mg by mouth every 6 (six) hours as needed for mild pain, moderate pain or headache.     Marland Kitchen HYDROcodone-acetaminophen (NORCO/VICODIN) 5-325 MG tablet Take 1 tablet by mouth every 6 (six) hours as needed. 10 tablet 0  . Melatonin 5 MG TABS Take 5 mg by mouth at bedtime as needed (for sleep).    . mesalamine (LIALDA) 1.2 g EC tablet Take 2 tablets (2.4 g total) by mouth 2 (two) times daily. 120 tablet 6   Current Facility-Administered Medications on File Prior to Visit  Medication Dose Route Frequency Provider Last Rate Last Dose  . 0.9 %  sodium chloride infusion  500 mL Intravenous Continuous Irene Shipper, MD       Allergies  Allergen Reactions  . Codeine Rash and Other (See Comments)    Reaction:  Dizziness    Family History  Problem Relation Age of Onset  . Liver disease Other   . Cancer Other        Breast cancer/aunt  . Cirrhosis Other        aunt died at 54  . Hepatitis Other        viral hepatitis  . Colon cancer Neg Hx   . Esophageal cancer Neg Hx   . Rectal cancer Neg Hx   . Stomach cancer Neg Hx    Social  History   Socioeconomic History  . Marital status: Married    Spouse name: None  . Number of children: None  . Years of education: None  . Highest education level: None  Social Needs  . Financial resource strain: None  . Food insecurity - worry: None  . Food insecurity - inability: None  . Transportation needs - medical: None  . Transportation needs - non-medical: None  Occupational History  . 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  Tobacco Use  . Smoking status: Never Smoker  . Smokeless tobacco: Never Used  Substance and Sexual Activity  . Alcohol use: No    Comment: very occasional  . Drug use: No  . Sexual  activity: Yes    Birth control/protection: Condom  Other Topics Concern  . None  Social History Narrative   Unemployed-Student   No children   Caffienated beverages-No   Seat Belt Use often-Yes   Regular exercise-Yes   Smoke alarm in home-Yes   Guns or firearms in home-No   History of physical abuse-No      Wife is a Marine scientist at [employer]   Depression screen [facility] 2/9 11/10/2017 08/17/2015 07/21/2015  Decreased Interest 0 0 0  Down, Depressed, Hopeless 0 0 0  PHQ - 2 Score 0 0 0    Review of Systems  Musculoskeletal: Positive for arthralgias.  All other systems reviewed and are negative.     Objective:   Physical Exam  Constitutional: He is oriented to person, place, and time. He appears well-developed and well-nourished. No distress.  HENT:  Head: Normocephalic and atraumatic.  Right Ear: Tympanic membrane normal.  Left Ear: Tympanic membrane normal.  Nose: Rhinorrhea (bilaterally) present.  Mouth/Throat: Mucous membranes are normal. Posterior oropharyngeal erythema present.  Eyes: Conjunctivae and EOM are normal.  Neck: Neck supple. No tracheal deviation present. No thyroid mass and no thyromegaly present.  Cardiovascular: Normal rate, regular rhythm, S1 normal, S2 normal and normal heart sounds.  Pulses:      Dorsalis pedis pulses are 2+ on the right side, and 2+ on the left side.  Pulmonary/Chest: Effort normal and breath sounds normal. No respiratory distress.  Abdominal: Soft. Bowel sounds are normal. He exhibits no distension. There is no hepatosplenomegaly. There is no tenderness.  Musculoskeletal: Normal range of motion. He exhibits no edema.  Lymphadenopathy:    He has no cervical adenopathy.  Neurological: He is alert and oriented to person, place, and time.  Reflex Scores:      Patellar reflexes are 2+ on the right side and 2+ on the left side. Skin: Skin is warm and dry.  Psychiatric: He has a normal mood and affect. His behavior is normal.  Nursing note and  vitals reviewed.  BP 118/72   Pulse 72   Temp 98.1 F (36.7 C)   Resp 16   Ht 5' 7"  (1.702 m)   Wt 185 lb 3.2 oz (84 kg)   SpO2 98%   BMI 29.01 kg/m     Results for orders placed or performed in visit on 11/10/17  POCT urinalysis dipstick  Result Value Ref Range   Color, UA yellow yellow   Clarity, UA clear clear   Glucose, UA negative negative mg/dL   Bilirubin, UA negative negative   Ketones, POC UA negative negative mg/dL   Spec Grav, UA >=1.030 (A) 1.010 - 1.025   Blood, UA negative negative   pH, UA 5.0 5.0 -  8.0   Protein Ur, POC negative negative mg/dL   Urobilinogen, UA 0.2 0.2 or 1.0 E.U./dL   Nitrite, UA Negative Negative   Leukocytes, UA Negative Negative   Assessment & Plan:   1. Annual physical exam   2. Screening for cardiovascular, respiratory, and genitourinary diseases   3. Routine screening for STI (sexually transmitted infection)   4. Screening for deficiency anemia   5. Screening for thyroid disorder   6. ULCERATIVE COLITIS-LEFT SIDE   7. Need for tetanus, diphtheria, and acellular pertussis (Tdap) vaccine in patient of adolescent age or older     Orders Placed This Encounter  Procedures  . GC/Chlamydia Probe Amp  . Tdap vaccine greater than or equal to 7yo IM  . Comprehensive metabolic panel    Order Specific Question:   Has the patient fasted?    Answer:   Yes  . Lipid panel    Order Specific Question:   Has the patient fasted?    Answer:   Yes  . HIV antibody  . HCV Ab w/Rflx to Verification  . RPR  . CBC with Differential/Platelet  . TSH  . Ferritin  . Interpretation:  . POCT urinalysis dipstick    No orders of the defined types were placed in this encounter.   I personally performed the services described in this documentation, which was scribed in my presence. The recorded information has been reviewed and considered, and addended by me as needed.   Delman Cheadle, M.D.  Primary Care at [facility] [address] [place],  [address] [phone] phone [phone] fax  11/13/17 1:18 AM

## 2017-11-11 LAB — COMPREHENSIVE METABOLIC PANEL
A/G RATIO: 1.6 (ref 1.2–2.2)
ALBUMIN: 4.6 g/dL (ref 3.5–5.5)
ALK PHOS: 87 IU/L (ref 39–117)
ALT: 24 IU/L (ref 0–44)
AST: 17 IU/L (ref 0–40)
BILIRUBIN TOTAL: 0.4 mg/dL (ref 0.0–1.2)
BUN/Creatinine Ratio: 15 (ref 9–20)
BUN: 12 mg/dL (ref 6–20)
CO2: 22 mmol/L (ref 20–29)
Calcium: 9.8 mg/dL (ref 8.7–10.2)
Chloride: 105 mmol/L (ref 96–106)
Creatinine, Ser: 0.8 mg/dL (ref 0.76–1.27)
GFR, EST AFRICAN AMERICAN: 134 mL/min/{1.73_m2} (ref >59)
GFR, EST NON AFRICAN AMERICAN: 116 mL/min/{1.73_m2} (ref >59)
GLOBULIN, TOTAL: 2.8 g/dL (ref 1.5–4.5)
Glucose: 90 mg/dL (ref 65–99)
POTASSIUM: 4.4 mmol/L (ref 3.5–5.2)
SODIUM: 140 mmol/L (ref 134–144)
Total Protein: 7.4 g/dL (ref 6.0–8.5)

## 2017-11-11 LAB — RPR: RPR Ser Ql: NONREACTIVE

## 2017-11-11 LAB — CBC WITH DIFFERENTIAL/PLATELET
BASOS: 0 %
Basophils Absolute: 0 10*3/uL (ref 0.0–0.2)
EOS (ABSOLUTE): 0.1 10*3/uL (ref 0.0–0.4)
EOS: 1 %
Hematocrit: 47.3 % (ref 37.5–51.0)
Hemoglobin: 15.6 g/dL (ref 13.0–17.7)
IMMATURE GRANS (ABS): 0 10*3/uL (ref 0.0–0.1)
IMMATURE GRANULOCYTES: 0 %
LYMPHS: 45 %
Lymphocytes Absolute: 2.8 10*3/uL (ref 0.7–3.1)
MCH: 30.4 pg (ref 26.6–33.0)
MCHC: 33 g/dL (ref 31.5–35.7)
MCV: 92 fL (ref 79–97)
Monocytes Absolute: 0.4 10*3/uL (ref 0.1–0.9)
Monocytes: 6 %
NEUTROS PCT: 48 %
Neutrophils Absolute: 3 10*3/uL (ref 1.4–7.0)
PLATELETS: 318 10*3/uL (ref 150–379)
RBC: 5.14 x10E6/uL (ref 4.14–5.80)
RDW: 14.2 % (ref 12.3–15.4)
WBC: 6.2 10*3/uL (ref 3.4–10.8)

## 2017-11-11 LAB — HIV ANTIBODY (ROUTINE TESTING W REFLEX): HIV Screen 4th Generation wRfx: NONREACTIVE

## 2017-11-11 LAB — LIPID PANEL
Chol/HDL Ratio: 3.9 ratio (ref 0.0–5.0)
Cholesterol, Total: 195 mg/dL (ref 100–199)
HDL: 50 mg/dL (ref >39)
LDL CALC: 122 mg/dL — AB (ref 0–99)
TRIGLYCERIDES: 113 mg/dL (ref 0–149)
VLDL CHOLESTEROL CAL: 23 mg/dL (ref 5–40)

## 2017-11-11 LAB — FERRITIN: Ferritin: 246 ng/mL (ref 30–400)

## 2017-11-11 LAB — TSH: TSH: 2.14 u[IU]/mL (ref 0.450–4.500)

## 2017-11-11 LAB — HCV INTERPRETATION

## 2017-11-11 LAB — HCV AB W/RFLX TO VERIFICATION: HCV AB: 0.1 {s_co_ratio} (ref 0.0–0.9)

## 2017-11-12 LAB — GC/CHLAMYDIA PROBE AMP
CHLAMYDIA, DNA PROBE: NEGATIVE
Neisseria gonorrhoeae by PCR: NEGATIVE

## 2018-01-15 ENCOUNTER — Other Ambulatory Visit: Payer: Self-pay | Admitting: Internal Medicine

## 2018-03-24 ENCOUNTER — Telehealth: Payer: Self-pay | Admitting: Internal Medicine

## 2018-03-24 NOTE — Telephone Encounter (Signed)
Patient states that he thinks he is having a flare up from his UC and would like to speak with a nurse about what he could do. Pt scheduled for ov on 5.15.19.

## 2018-03-24 NOTE — Telephone Encounter (Signed)
Pt states he has been having bloody diarrhea for several weeks, thinks he is having a crohns flare. Pt scheduled to see Nicoletta Ba PA tomorrow at 2pm. Pt aware of apppt.

## 2018-03-25 ENCOUNTER — Ambulatory Visit: Payer: Commercial Managed Care - PPO | Admitting: Physician Assistant

## 2018-04-06 ENCOUNTER — Encounter: Payer: Self-pay | Admitting: Physician Assistant

## 2018-04-06 ENCOUNTER — Other Ambulatory Visit (INDEPENDENT_AMBULATORY_CARE_PROVIDER_SITE_OTHER): Payer: Commercial Managed Care - PPO

## 2018-04-06 ENCOUNTER — Ambulatory Visit (INDEPENDENT_AMBULATORY_CARE_PROVIDER_SITE_OTHER): Payer: Commercial Managed Care - PPO | Admitting: Physician Assistant

## 2018-04-06 VITALS — BP 108/80 | HR 82 | Ht 67.0 in | Wt 188.0 lb

## 2018-04-06 DIAGNOSIS — K921 Melena: Secondary | ICD-10-CM | POA: Diagnosis not present

## 2018-04-06 DIAGNOSIS — R197 Diarrhea, unspecified: Secondary | ICD-10-CM

## 2018-04-06 DIAGNOSIS — R1031 Right lower quadrant pain: Secondary | ICD-10-CM

## 2018-04-06 DIAGNOSIS — K51519 Left sided colitis with unspecified complications: Secondary | ICD-10-CM | POA: Diagnosis not present

## 2018-04-06 DIAGNOSIS — R1032 Left lower quadrant pain: Secondary | ICD-10-CM

## 2018-04-06 LAB — COMPREHENSIVE METABOLIC PANEL
ALBUMIN: 4.2 g/dL (ref 3.5–5.2)
ALT: 33 U/L (ref 0–53)
AST: 15 U/L (ref 0–37)
Alkaline Phosphatase: 73 U/L (ref 39–117)
BUN: 14 mg/dL (ref 6–23)
CALCIUM: 9.3 mg/dL (ref 8.4–10.5)
CHLORIDE: 105 meq/L (ref 96–112)
CO2: 23 mEq/L (ref 19–32)
CREATININE: 0.75 mg/dL (ref 0.40–1.50)
GFR: 125.35 mL/min (ref >60.00)
Glucose, Bld: 99 mg/dL (ref 70–99)
Potassium: 3.8 mEq/L (ref 3.5–5.1)
Sodium: 136 mEq/L (ref 135–145)
Total Bilirubin: 0.5 mg/dL (ref 0.2–1.2)
Total Protein: 7.2 g/dL (ref 6.0–8.3)

## 2018-04-06 LAB — CBC WITH DIFFERENTIAL/PLATELET
BASOS PCT: 0.4 % (ref 0.0–3.0)
Basophils Absolute: 0 10*3/uL (ref 0.0–0.1)
EOS ABS: 0.1 10*3/uL (ref 0.0–0.7)
Eosinophils Relative: 1 % (ref 0.0–5.0)
HEMATOCRIT: 44.8 % (ref 39.0–52.0)
HEMOGLOBIN: 15.2 g/dL (ref 13.0–17.0)
LYMPHS PCT: 35.5 % (ref 12.0–46.0)
Lymphs Abs: 2 10*3/uL (ref 0.7–4.0)
MCHC: 33.8 g/dL (ref 30.0–36.0)
MCV: 91 fl (ref 78.0–100.0)
Monocytes Absolute: 0.4 10*3/uL (ref 0.1–1.0)
Monocytes Relative: 6.6 % (ref 3.0–12.0)
Neutro Abs: 3.2 10*3/uL (ref 1.4–7.7)
Neutrophils Relative %: 56.5 % (ref 43.0–77.0)
Platelets: 300 10*3/uL (ref 150.0–400.0)
RBC: 4.93 Mil/uL (ref 4.22–5.81)
RDW: 13.5 % (ref 11.5–15.5)
WBC: 5.7 10*3/uL (ref 4.0–10.5)

## 2018-04-06 LAB — SEDIMENTATION RATE: Sed Rate: 10 mm/hr (ref 0–15)

## 2018-04-06 LAB — HIGH SENSITIVITY CRP: CRP, High Sensitivity: 1.67 mg/L (ref 0.000–5.000)

## 2018-04-06 MED ORDER — PREDNISONE 10 MG PO TABS: ORAL_TABLET | ORAL | 0 refills | Status: AC

## 2018-04-06 MED ORDER — PREDNISONE 10 MG PO TABS: ORAL_TABLET | ORAL | 0 refills | Status: DC

## 2018-04-06 NOTE — Patient Instructions (Signed)
Patient would benefit from canine companionship to assist for emotional support during flares of ulcerative colitis.   Your provider has requested that you go to the basement level for lab work before leaving today. Press "B" on the elevator. The lab is located at the first door on the left as you exit the elevator.  We have sent the following medications to your pharmacy for you to pick up at your convenience: Prednisone 40 mg taper decrease by 5 mg every week.

## 2018-04-06 NOTE — Progress Notes (Signed)
Chief Complaint: Ulcerative colitis flare  HPI:    [name] is a 37 year old male with a past medical history as listed below including chronic left-sided ulcerative colitis diagnosed in August 2010 (colonoscopy with biopsies), who follows with Dr. Henrene Pastor and presents to clinic today with a complaint of hematochezia, diarrhea and abdominal pain.    05/05/17 OV Dr. Henrene Pastor with complaint of rectal bleeding.  Continued on Lialda 4.8 g daily and scheduled for colonoscopy.  07/23/17 colonoscopy with patchy mild inflammation in the descending colon secondary to colitis, internal hemorrhoids and otherwise normal exam.  Repeat recommended in 5 years.    Today, explains that he has had an increase in diarrheal stools at least 6-8 times a day accompanied by bright red blood and lower abdominal cramping before bowel movement which is better afterwards.  This started about 3 weeks ago.  Associated symptoms include fatigue and a headache.  Describes that this is similar to when he had his first flare of ulcerative colitis when diagnosed.  Has continued Lialda 4.8 g/day.  Denies NSAID use.    Denies fever, chills, weight loss, anorexia, nausea, vomiting, heartburn, reflux or recent use of antibiotics.  Past Medical History:  Diagnosis Date  . Anal fissure   . Appendicitis   . Ulcerative colitis     Past Surgical History:  Procedure Laterality Date  . APPENDECTOMY    . GANGLION CYST EXCISION     left hand  . LAPAROSCOPIC APPENDECTOMY N/A 08/17/2015   Procedure: APPENDECTOMY LAPAROSCOPIC;  Surgeon: Michael Boston, MD;  Location: [facility];  Service: General;  Laterality: N/A;    Current Outpatient Medications  Medication Sig Dispense Refill  . acetaminophen (TYLENOL) 500 MG tablet Take 1,000 mg by mouth every 6 (six) hours as needed for mild pain, moderate pain or headache.     Marland Kitchen HYDROcodone-acetaminophen (NORCO/VICODIN) 5-325 MG tablet Take 1 tablet by mouth every 6 (six) hours as needed. 10 tablet 0  .  Melatonin 5 MG TABS Take 5 mg by mouth at bedtime as needed (for sleep).    . mesalamine (LIALDA) 1.2 g EC tablet TAKE 2 TABLETS (2.4 G TOTAL) BY MOUTH 2 (TWO) TIMES DAILY. 120 tablet 3   Current Facility-Administered Medications  Medication Dose Route Frequency Provider Last Rate Last Dose  . 0.9 %  sodium chloride infusion  500 mL Intravenous Continuous Irene Shipper, MD        Allergies as of 04/06/2018 - Review Complete 04/06/2018  Allergen Reaction Noted  . Codeine Rash and Other (See Comments)     Family History  Problem Relation Age of Onset  . Liver disease Other   . Cancer Other        Breast cancer/aunt  . Cirrhosis Other        aunt died at 5  . Hepatitis Other        viral hepatitis  . Colon cancer Neg Hx   . Esophageal cancer Neg Hx   . Rectal cancer Neg Hx   . Stomach cancer Neg Hx     Social History   Socioeconomic History  . Marital status: Married    Spouse name: Not on file  . Number of children: Not on file  . Years of education: Not on file  . Highest education level: Not on file  Occupational History  . 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  Social Needs  . Financial resource strain: Not  on file  . Food insecurity:    Worry: Not on file    Inability: Not on file  . Transportation needs:    Medical: Not on file    Non-medical: Not on file  Tobacco Use  . Smoking status: Never Smoker  . Smokeless tobacco: Never Used  Substance and Sexual Activity  . Alcohol use: No    Comment: very occasional  . Drug use: No  . Sexual activity: Yes    Birth control/protection: Condom  Lifestyle  . Physical activity:    Days per week: Not on file    Minutes per session: Not on file  . Stress: Not on file  Relationships  . Social connections:    Talks on phone: Not on file    Gets together: Not on file    Attends religious service: Not on file    Active member of club or organization: Not on file    Attends  meetings of clubs or organizations: Not on file    Relationship status: Not on file  . Intimate partner violence:    Fear of current or ex partner: Not on file    Emotionally abused: Not on file    Physically abused: Not on file    Forced sexual activity: Not on file  Other Topics Concern  . Not on file  Social History Narrative   Unemployed-Student   No children   Caffienated beverages-No   Therapist, art Use often-Yes   Regular exercise-Yes   Smoke alarm in home-Yes   Guns or firearms in home-No   History of physical abuse-No      Wife is a Marine scientist at [employer]    Review of Systems:    Constitutional: No weight loss, fever or chills Skin: No rash  Cardiovascular: No chest pain Respiratory: No SOB Gastrointestinal: See HPI and otherwise negative Genitourinary: No dysuria  Neurological: + Headache Musculoskeletal: No new muscle or joint pain Hematologic: No bruising Psychiatric: No history of depression or anxiety   Physical Exam:  Vital signs: BP 108/80   Pulse 82   Ht 5' 7"  (1.702 m)   Wt 188 lb (85.3 kg)   BMI 29.44 kg/m   Constitutional:   Pleasant Hispanic male appears to be in NAD, Well developed, Well nourished, alert and cooperative Head:  Normocephalic and atraumatic. Eyes:   PEERL, EOMI. No icterus. Conjunctiva pink. Ears:  Normal auditory acuity. Neck:  Supple Throat: Oral cavity and pharynx without inflammation, swelling or lesion.  Respiratory: Respirations even and unlabored. Lungs clear to auscultation bilaterally.   No wheezes, crackles, or rhonchi.  Cardiovascular: Normal S1, S2. No MRG. Regular rate and rhythm. No peripheral edema, cyanosis or pallor.  Gastrointestinal:  Soft, nondistended, Moderate b/l lower abdominal ttp, with guarding, Normal bowel sounds. No appreciable masses or hepatomegaly. Rectal:  Not performed.  Msk:  Symmetrical without gross deformities. Without edema, no deformity or joint abnormality.  Neurologic:  Alert and  oriented  x4;  grossly normal neurologically.  Skin:   Dry and intact without significant lesions or rashes. Psychiatric:  Demonstrates good judgement and reason without abnormal affect or behaviors.  No recent labs or imaging.  Assessment: 1.  Ulcerative colitis with complication: Describes increase in loose bowel movements at least 6-8/day, accompanied by bleeding and abdominal cramping as well as fatigue and headaches; most likely flare of ulcerative colitis  Plan: 1.  Ordered maintenance labs to include CBC, CMP also CRP and ESR today 2.  Prescribed Prednisone taper  40 mg daily, decreasing by 5 mg/week over 8 weeks.  Discussed in detail with patient. 3.  Continue Lialda 4.8 g daily 4.  Patient will follow with Dr. Henrene Pastor in June after finishing taper.  Explained that he should call if he has any further concerns before then.  Ellouise Newer, PA-C [facility] 04/06/2018, 10:16 AM  Cc: Janith Lima, MD

## 2018-04-12 NOTE — Progress Notes (Signed)
Assessment and plans reviewed. Blood work normal

## 2018-05-04 ENCOUNTER — Ambulatory Visit: Payer: Commercial Managed Care - PPO | Admitting: Internal Medicine

## 2018-06-01 ENCOUNTER — Ambulatory Visit: Payer: Commercial Managed Care - PPO | Admitting: Internal Medicine

## 2018-07-22 ENCOUNTER — Ambulatory Visit: Payer: Commercial Managed Care - PPO | Admitting: Internal Medicine

## 2018-09-02 ENCOUNTER — Ambulatory Visit: Payer: Commercial Managed Care - PPO | Admitting: Internal Medicine

## 2018-09-19 ENCOUNTER — Other Ambulatory Visit: Payer: Self-pay | Admitting: Internal Medicine

## 2018-10-04 ENCOUNTER — Ambulatory Visit: Payer: Commercial Managed Care - PPO | Admitting: Internal Medicine

## 2018-10-04 ENCOUNTER — Encounter: Payer: Self-pay | Admitting: Internal Medicine

## 2018-10-04 VITALS — BP 118/70 | HR 81 | Ht 67.0 in | Wt 196.0 lb

## 2018-10-04 DIAGNOSIS — K51519 Left sided colitis with unspecified complications: Secondary | ICD-10-CM

## 2018-10-04 DIAGNOSIS — K648 Other hemorrhoids: Secondary | ICD-10-CM

## 2018-10-04 DIAGNOSIS — K625 Hemorrhage of anus and rectum: Secondary | ICD-10-CM

## 2018-10-04 DIAGNOSIS — K219 Gastro-esophageal reflux disease without esophagitis: Secondary | ICD-10-CM

## 2018-10-04 MED ORDER — MESALAMINE 1.2 G PO TBEC: 2.4000 g | DELAYED_RELEASE_TABLET | Freq: Two times a day (BID) | ORAL | 1 refills | Status: DC

## 2018-10-04 NOTE — Patient Instructions (Signed)
We have sent the following medications to your pharmacy for you to pick up at your convenience:  Lialda  Please follow up in one year

## 2018-10-04 NOTE — Progress Notes (Signed)
HISTORY OF PRESENT ILLNESS:  George Morrison is a 37 y.o. male with left-sided ulcerative colitis who presents today for routine follow-up.  He has been maintained on Lialda 4.8 g daily.  Last colonoscopy August 2018 with very mild patchy erythema in the left colon and internal hemorrhoids.  Biopsies revealed mild chronic active colitis.  No dysplasia.  He was seen earlier this year with what seemed to be a flare of his colitis.  He was prescribed prednisone but did not take prednisone being concerned about side effects and noting that he was feeling better within a few days of his office evaluation.  Blood work at that time including CBC, comprehensive metabolic panel, and C-reactive protein were normal.  Hemoglobin 15.2.  He is continued on Lialda 4.8 g daily.  He does mention to me that with more formed bowel movements he will see occasional streaks of blood on his stool.  No significant rectal pain though he does experience some itching.  GI review of systems is otherwise remarkable for rare occasions of heartburn with dietary indiscretion.  He has gained some weight since injuring his knee.  REVIEW OF SYSTEMS:  All non-GI ROS negative unless otherwise stated in the HPI except for fatigue  Past Medical History:  Diagnosis Date  . Anal fissure   . Appendicitis   . Ulcerative colitis     Past Surgical History:  Procedure Laterality Date  . APPENDECTOMY    . GANGLION CYST EXCISION     left hand  . LAPAROSCOPIC APPENDECTOMY N/A 08/17/2015   Procedure: APPENDECTOMY LAPAROSCOPIC;  Surgeon: Michael Boston, MD;  Location: [facility];  Service: General;  Laterality: N/A;    Social History George Morrison  reports that he has never smoked. He has never used smokeless tobacco. He reports that he does not drink alcohol or use drugs.  family history includes Cancer in his other; Cirrhosis in his other; Hepatitis in his other; Liver disease in his other.  Allergies  Allergen Reactions  . Codeine Rash  and Other (See Comments)    Reaction:  Dizziness        PHYSICAL EXAMINATION: Vital signs: BP 118/70   Pulse 81   Ht 5' 7"  (1.702 m)   Wt 196 lb (88.9 kg)   BMI 30.70 kg/m   Constitutional: generally well-appearing, no acute distress Psychiatric: alert and oriented x3, cooperative Eyes: extraocular movements intact, anicteric, conjunctiva pink Mouth: oral pharynx moist, no lesions Neck: supple no lymphadenopathy Cardiovascular: heart regular rate and rhythm, no murmur Lungs: clear to auscultation bilaterally Abdomen: soft, nontender, nondistended, no obvious ascites, no peritoneal signs, normal bowel sounds, no organomegaly Rectal: No external abnormalities.  Internal hemorrhoids. Extremities: no clubbing, cyanosis, or lower extremity edema bilaterally Skin: no lesions on visible extremities Neuro: No focal deficits.  Cranial nerves intact  ASSESSMENT:  1.  Left-sided ulcerative colitis.  Currently asymptomatic 2.  Minor rectal bleeding secondary to hemorrhoids 3.  Occasional reflux symptoms without alarm features   PLAN:  1.  Continue Lialda 4.8 g daily.  Prescription refilled 2.  Increase fiber supplementation 3.  Reassurance regarding hemorrhoids.  Offered medicated suppositories.  He will let us know. 4.  Reflux precautions 5.  Routine GI office follow-up 1 year.  Sooner if needed  25 minutes spent face-to-face with the patient.  Greater than 50% the time is for counseling regarding management of his ulcerative colitis, evaluation of his rectal bleeding, and treatment options for hemorrhoids.

## 2019-07-09 ENCOUNTER — Ambulatory Visit (HOSPITAL_COMMUNITY)
Admission: EM | Admit: 2019-07-09 | Discharge: 2019-07-09 | Disposition: A | Discharge status: Home / Self Care | Payer: No Typology Code available for payment source

## 2019-07-09 ENCOUNTER — Encounter (HOSPITAL_COMMUNITY): Payer: Self-pay | Admitting: *Deleted

## 2019-07-09 ENCOUNTER — Other Ambulatory Visit: Payer: Self-pay

## 2019-07-09 DIAGNOSIS — S161XXA Strain of muscle, fascia and tendon at neck level, initial encounter: Secondary | ICD-10-CM | POA: Diagnosis not present

## 2019-07-09 DIAGNOSIS — R519 Headache, unspecified: Secondary | ICD-10-CM

## 2019-07-09 MED ORDER — CYCLOBENZAPRINE HCL 5 MG PO TABS: 5.0000 mg | ORAL_TABLET | Freq: Three times a day (TID) | ORAL | 0 refills | Status: DC | PRN

## 2019-07-09 MED ORDER — NAPROXEN 500 MG PO TABS: 500.0000 mg | ORAL_TABLET | Freq: Two times a day (BID) | ORAL | 0 refills | Status: DC
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
## 2019-07-21 ENCOUNTER — Ambulatory Visit (HOSPITAL_COMMUNITY)
Admission: EM | Admit: 2019-07-21 | Discharge: 2019-07-21 | Disposition: A | Discharge status: Home / Self Care | Payer: No Typology Code available for payment source | Attending: Emergency Medicine | Admitting: Emergency Medicine

## 2019-07-21 ENCOUNTER — Encounter (HOSPITAL_COMMUNITY): Payer: Self-pay | Admitting: Emergency Medicine

## 2019-07-21 ENCOUNTER — Other Ambulatory Visit: Payer: Self-pay

## 2019-07-21 DIAGNOSIS — L255 Unspecified contact dermatitis due to plants, except food: Secondary | ICD-10-CM | POA: Diagnosis not present

## 2019-07-21 DIAGNOSIS — Z20828 Contact with and (suspected) exposure to other viral communicable diseases: Secondary | ICD-10-CM | POA: Diagnosis not present

## 2019-07-21 DIAGNOSIS — Z20822 Contact with and (suspected) exposure to covid-19: Secondary | ICD-10-CM

## 2019-07-21 MED ORDER — METHYLPREDNISOLONE SODIUM SUCC 125 MG IJ SOLR
INTRAMUSCULAR | Status: AC
  Filled 2019-07-21: qty 2

## 2019-07-21 MED ORDER — METHYLPREDNISOLONE SODIUM SUCC 125 MG IJ SOLR
125.0000 mg | Freq: Once | INTRAMUSCULAR | Status: AC
  Administered 2019-07-21: 125 mg via INTRAMUSCULAR
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
## 2019-07-21 NOTE — Discharge Instructions (Addendum)
You were given a shot of a steroid today called Solu-Medrol to treat your rash.  You can continue to use the calamine lotion.  You can also take over-the-counter Benadryl for itching.    Your COVID test is pending.  We will call you if it is positive.  You should self quarantine until your test is back and is negative.    Go to the emergency department if you develop difficulty breathing, shortness of breath, high fever, severe diarrhea, or other symptoms.
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
## 2019-07-23 LAB — NOVEL CORONAVIRUS, NAA (HOSP ORDER, SEND-OUT TO REF LAB; TAT 18-24 HRS): SARS-CoV-2, NAA: NOT DETECTED

## 2019-07-24 ENCOUNTER — Encounter (HOSPITAL_COMMUNITY): Payer: Self-pay

## 2019-09-23 ENCOUNTER — Telehealth: Payer: Self-pay | Admitting: Internal Medicine

## 2019-09-23 ENCOUNTER — Other Ambulatory Visit: Payer: Self-pay | Admitting: Internal Medicine

## 2019-09-23 MED ORDER — MESALAMINE 4 G RE ENEM: 4.0000 g | ENEMA | Freq: Every day | RECTAL | 0 refills | Status: DC

## 2019-09-23 NOTE — Telephone Encounter (Signed)
3 days of tenesmus, small loose stools w/o blood Requesting mesalamine enema Rx and will schedule f/u Dr. Henrene Pastor

## 2019-09-23 NOTE — Telephone Encounter (Signed)
Called her # and patient # multiple x and to voicemail left message to call bacek  Was paged about a flare

## 2019-10-03 ENCOUNTER — Telehealth: Payer: Self-pay | Admitting: Internal Medicine

## 2019-10-03 MED ORDER — MESALAMINE 1.2 G PO TBEC: 2.4000 g | DELAYED_RELEASE_TABLET | Freq: Two times a day (BID) | ORAL | 1 refills | Status: DC

## 2019-10-03 NOTE — Telephone Encounter (Signed)
I sent the patient a [facility] message and also refilled his Lialda to cover until he comes in.

## 2019-11-15 ENCOUNTER — Telehealth: Payer: Self-pay | Admitting: Internal Medicine

## 2019-11-15 ENCOUNTER — Encounter: Payer: Self-pay | Admitting: Internal Medicine

## 2019-11-15 MED ORDER — MESALAMINE 1.2 G PO TBEC: 2.4000 g | DELAYED_RELEASE_TABLET | Freq: Two times a day (BID) | ORAL | 0 refills | Status: DC

## 2019-11-15 NOTE — Telephone Encounter (Signed)
Patient has December appointment with Dr Henrene Pastor.

## 2019-12-01 ENCOUNTER — Ambulatory Visit: Payer: No Typology Code available for payment source | Admitting: Internal Medicine

## 2019-12-30 ENCOUNTER — Other Ambulatory Visit: Payer: Self-pay | Admitting: Internal Medicine

## 2020-01-01 ENCOUNTER — Other Ambulatory Visit: Payer: Self-pay | Admitting: Internal Medicine

## 2020-01-09 ENCOUNTER — Ambulatory Visit: Payer: No Typology Code available for payment source | Admitting: Internal Medicine

## 2020-01-09 ENCOUNTER — Encounter: Payer: Self-pay | Admitting: Internal Medicine

## 2020-01-09 VITALS — BP 110/74 | HR 76 | Temp 97.4°F | Ht 67.0 in | Wt 192.6 lb

## 2020-01-09 DIAGNOSIS — R42 Dizziness and giddiness: Secondary | ICD-10-CM | POA: Diagnosis not present

## 2020-01-09 DIAGNOSIS — K625 Hemorrhage of anus and rectum: Secondary | ICD-10-CM | POA: Diagnosis not present

## 2020-01-09 DIAGNOSIS — R197 Diarrhea, unspecified: Secondary | ICD-10-CM

## 2020-01-09 DIAGNOSIS — K51519 Left sided colitis with unspecified complications: Secondary | ICD-10-CM

## 2020-01-09 MED ORDER — MESALAMINE 1.2 G PO TBEC: DELAYED_RELEASE_TABLET | ORAL | 3 refills | Status: DC

## 2020-01-09 MED ORDER — MESALAMINE 4 G RE ENEM: 4.0000 g | ENEMA | Freq: Every day | RECTAL | 3 refills | Status: DC

## 2020-01-09 NOTE — Patient Instructions (Signed)
We have sent the following medications to your pharmacy for you to pick up at your convenience:  Mesalamine enemas, Lialda  Please follow up in one year

## 2020-01-09 NOTE — Progress Notes (Signed)
HISTORY OF PRESENT ILLNESS:  George Morrison is a 39 y.o. male with left-sided ulcerative colitis who presents today for follow-up after having experienced a possible flare.  Patient states that in October 2020 he developed bloating and difficulties with his bowels.  Possibly trivial bleeding.  He thought that this may represent the beginning of an ulcerative colitis flare.  He contacted the office and at his request had his Rowasa enemas and Lialda refilled.  He took enemas for about 3 weeks.  His symptoms resolved.  He has not been using enemas since.  However he does continue on Lialda 4.8 g daily.  His GI review of systems is otherwise negative.  He does tell me that he has had some intermittent dizziness over the past year without syncope or headaches.  Does not sound like vertigo.  Does seem to improve with certain positions such as sitting.  He has not had this evaluated by his primary care provider.  He does tell me that he had blood work drawn at urgent care about 2 weeks ago.  This for routine annual physical evaluation.  We are having him sign a release to obtain and review his blood work.  REVIEW OF SYSTEMS:  All non-GI ROS negative unless otherwise stated in the HPI except for dizziness as noted  Past Medical History:  Diagnosis Date  . Anal fissure   . Appendicitis   . Ulcerative colitis     Past Surgical History:  Procedure Laterality Date  . APPENDECTOMY    . GANGLION CYST EXCISION     left hand  . KNEE ARTHROSCOPY W/ ACL RECONSTRUCTION    . LAPAROSCOPIC APPENDECTOMY N/A 08/17/2015   Procedure: APPENDECTOMY LAPAROSCOPIC;  Surgeon: Michael Boston, MD;  Location: [facility];  Service: General;  Laterality: N/A;    Social History George Morrison  reports that he has never smoked. He has never used smokeless tobacco. He reports previous alcohol use. He reports that he does not use drugs.  family history includes Cancer in an other family member; Cirrhosis in an other family member;  Hepatitis in an other family member; Liver disease in an other family member.  Allergies  Allergen Reactions  . Codeine Rash and Other (See Comments)    Reaction:  Dizziness        PHYSICAL EXAMINATION: Vital signs: BP 110/74   Pulse 76   Temp (!) 97.4 F (36.3 C)   Ht 5' 7"  (1.702 m)   Wt 192 lb 9.6 oz (87.4 kg)   BMI 30.17 kg/m   Constitutional: generally well-appearing, no acute distress Psychiatric: alert and oriented x3, cooperative Eyes: extraocular movements intact, anicteric, conjunctiva pink Mouth: Mask Neck: supple no lymphadenopathy Cardiovascular: heart regular rate and rhythm, no murmur Lungs: clear to auscultation bilaterally Abdomen: soft, nontender, nondistended, no obvious ascites, no peritoneal signs, normal bowel sounds, no organomegaly Rectal: Omitted Extremities: no lower extremity edema bilaterally Skin: no lesions on visible extremities Neuro: No focal deficits. No asterixis.    ASSESSMENT:  1.  Left-sided ulcerative colitis.  Possible recent flare.  Currently asymptomatic on Lialda 2.  Dizziness.  Recommend that he be evaluated by his PCP.  He is agreeable   PLAN:  1.  Refill Rowasa enemas x1 year.  Medication risks reviewed 2.  Refill Lialda x1 year.  Medication risks reviewed 3.  Obtain outside blood work for review 4.  Routine GI follow-up 1 year.  Sooner if needed 30 minutes in total spent preparing to see the patient,  reviewing test, performing medically appropriate physical exam, counseling and educating the patient, ordering medications, soliciting outside records, and documenting clinical information in the health record

## 2020-03-25 ENCOUNTER — Other Ambulatory Visit: Payer: Self-pay

## 2020-03-25 MED ORDER — MESALAMINE 1.2 G PO TBEC: DELAYED_RELEASE_TABLET | ORAL | 3 refills | Status: DC

## 2020-11-20 ENCOUNTER — Other Ambulatory Visit: Payer: Self-pay | Admitting: Internal Medicine

## 2021-03-21 ENCOUNTER — Telehealth: Payer: Self-pay | Admitting: Internal Medicine

## 2021-03-21 NOTE — Telephone Encounter (Signed)
Wife, Beverlee Nims, called in about the medication Lialda. States she believe he is having side effects and wants to discuss.

## 2021-03-24 NOTE — Telephone Encounter (Signed)
This sounds Triage-y

## 2021-03-24 NOTE — Telephone Encounter (Signed)
Not related. Needs to stay on medication for colitis. See PCP regarding insomnia. Thanks for checking

## 2021-03-24 NOTE — Telephone Encounter (Signed)
Spoke with pts wife and she is aware.

## 2021-03-24 NOTE — Telephone Encounter (Signed)
Wife states pt has been taking lialda for years and he has had trouble sleeping and it has been progressively worse. They think the insomnia is related to the lialda. Please advise.

## 2021-04-14 ENCOUNTER — Other Ambulatory Visit (HOSPITAL_COMMUNITY): Payer: Self-pay

## 2021-04-18 ENCOUNTER — Other Ambulatory Visit (HOSPITAL_COMMUNITY): Payer: Self-pay

## 2021-04-18 ENCOUNTER — Telehealth: Payer: Self-pay | Admitting: Internal Medicine

## 2021-04-18 MED ORDER — MESALAMINE 1.2 G PO TBEC
DELAYED_RELEASE_TABLET | ORAL | 1 refills | Status: DC
  Filled 2021-04-18: qty 360, 90d supply, fill #0

## 2021-04-18 NOTE — Telephone Encounter (Signed)
Sent Lialda to [facility] outpatient pharmacy

## 2021-04-18 NOTE — Telephone Encounter (Signed)
Patient called to RF Lialda to be sent to [facility] outpatient pharmacy

## 2021-05-12 ENCOUNTER — Encounter (HOSPITAL_COMMUNITY): Payer: Self-pay

## 2021-05-12 ENCOUNTER — Other Ambulatory Visit (HOSPITAL_COMMUNITY): Payer: Self-pay

## 2021-05-12 ENCOUNTER — Ambulatory Visit (HOSPITAL_COMMUNITY)
Admission: EM | Admit: 2021-05-12 | Discharge: 2021-05-12 | Disposition: A | Discharge status: Home / Self Care | Payer: No Typology Code available for payment source | Attending: Emergency Medicine | Admitting: Emergency Medicine

## 2021-05-12 ENCOUNTER — Other Ambulatory Visit: Payer: Self-pay

## 2021-05-12 DIAGNOSIS — L237 Allergic contact dermatitis due to plants, except food: Secondary | ICD-10-CM | POA: Diagnosis not present

## 2021-05-12 MED ORDER — PREDNISONE 20 MG PO TABS
40.0000 mg | ORAL_TABLET | Freq: Every day | ORAL | 0 refills | Status: DC
  Filled 2021-05-12: qty 10, 5d supply, fill #0

## 2021-05-12 MED ORDER — METHYLPREDNISOLONE SODIUM SUCC 125 MG IJ SOLR
INTRAMUSCULAR | Status: AC
  Filled 2021-05-12: qty 2

## 2021-05-12 MED ORDER — METHYLPREDNISOLONE SODIUM SUCC 125 MG IJ SOLR
60.0000 mg | Freq: Once | INTRAMUSCULAR | Status: AC
  Administered 2021-05-12: 60 mg via INTRAMUSCULAR

## 2021-05-12 NOTE — ED Provider Notes (Signed)
[place]    CSN: [id number] Arrival date & time: 05/12/21  5056      History   Chief Complaint Chief Complaint  Patient presents with  . Poison Ivy    HPI George Morrison is a 40 y.o. male.   Patient presents with rash on left anterior forearm beginning 1 week ago after exposure to poison ivy at work. area Is pruritic.  Has been treating with calamine lotion to help with itching but area has not cleared.  Has begun to spread to other areas of the body.  Has noticed a spot on the back of neck.  Denies fever, chills, discharge.   Past Medical History:  Diagnosis Date  . Anal fissure   . Appendicitis   . Ulcerative colitis     Patient Active Problem List   Diagnosis Date Noted  . 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 Hallux limitus 11/13/2015  . Appendicitis with peritonitis s/p lap appy 08/17/2015 08/17/2015  . Ganglion cyst of wrist 06/08/2011  . ULCERATIVE COLITIS-LEFT SIDE 08/30/2009    Past Surgical History:  Procedure Laterality Date  . APPENDECTOMY    . GANGLION CYST EXCISION     left hand  . KNEE ARTHROSCOPY W/ ACL RECONSTRUCTION    . LAPAROSCOPIC APPENDECTOMY N/A 08/17/2015   Procedure: APPENDECTOMY LAPAROSCOPIC;  Surgeon: Michael Boston, MD;  Location: [facility];  Service: General;  Laterality: N/A;       Home Medications    Prior to Admission medications   Medication Sig Start Date End Date Taking? Authorizing Provider  predniSONE (DELTASONE) 20 MG tablet Take 2 tablets (40 mg total) by mouth daily. 05/12/21  Yes Madonna Flegal, Leitha Schuller, NP  cyclobenzaprine (FLEXERIL) 5 MG tablet Take 1 tablet (5 mg total) by mouth 3 (three) times daily as needed for muscle spasms. 07/09/19   [name], PA-C  ibuprofen (ADVIL) 800 MG tablet Take 800 mg by mouth every 8 (eight) hours as needed.    [provider]  Melatonin 5 MG TABS Take 5 mg by mouth at bedtime as needed (for sleep).    [provider]  mesalamine (LIALDA) 1.2 g EC tablet  TAKE 2 TABLETS (2.4 G TOTAL) BY MOUTH 2 (TWO) TIMES DAILY. Office visit for further refills 04/18/21   Irene Shipper, MD  mesalamine (ROWASA) 4 g enema Place 60 mLs (4 g total) rectally at bedtime. 01/09/20   Irene Shipper, MD  naproxen (NAPROSYN) 500 MG tablet Take 1 tablet (500 mg total) by mouth 2 (two) times daily with a meal. 07/09/19   [name], PA-C    Family History Family History  Problem Relation Age of Onset  . Liver disease Other   . Cancer Other        Breast cancer/aunt  . Cirrhosis Other        aunt died at 57  . Hepatitis Other        viral hepatitis  . Colon cancer Neg Hx   . Esophageal cancer Neg Hx   . Rectal cancer Neg Hx   . Stomach cancer Neg Hx     Social History Social History   Tobacco Use  . Smoking status: Never Smoker  . Smokeless tobacco: Never Used  Vaping Use  . Vaping Use: Never used  Substance Use Topics  . Alcohol use: Not Currently  . Drug use: No     Allergies   Codeine   Review of Systems Review of Systems  Constitutional:  Negative.   Respiratory: Negative.   Cardiovascular: Negative.   Skin: Positive for wound. Negative for color change, pallor and rash.  Neurological: Negative.      Physical Exam Triage Vital Signs ED Triage Vitals  Enc Vitals Group     BP 05/12/21 0941 120/73     Pulse Rate 05/12/21 0941 62     Resp 05/12/21 0941 18     Temp 05/12/21 0941 98.9 F (37.2 C)     Temp Source 05/12/21 0941 Oral     SpO2 05/12/21 0941 98 %     Weight --      Height --      Head Circumference --      Peak Flow --      Pain Score 05/12/21 0943 1     Pain Loc --      Pain Edu? --      Excl. in [place]? --    No data found.  Updated Vital Signs BP 120/73 (BP Location: Right Arm)   Pulse 62   Temp 98.9 F (37.2 C) (Oral)   Resp 18   SpO2 98%   Visual Acuity Right Eye Distance:   Left Eye Distance:   Bilateral Distance:    Right Eye Near:   Left Eye Near:    Bilateral Near:     Physical  Exam Constitutional:      Appearance: Normal appearance. He is normal weight.  HENT:     Head: Normocephalic.  Eyes:     Extraocular Movements: Extraocular movements intact.  Pulmonary:     Effort: Pulmonary effort is normal.  Musculoskeletal:        General: Normal range of motion.  Skin:         Comments: Rating blisterlike rash small patches spread over the left anterior forearm, see picture for reference   Small cluster of blisters present on right side of posterior neck   Neurological:     General: No focal deficit present.     Mental Status: He is alert and oriented to person, place, and time. Mental status is at baseline.  Psychiatric:        Mood and Affect: Mood normal.        Behavior: Behavior normal.        Thought Content: Thought content normal.        Judgment: Judgment normal.      UC Treatments / Results  Labs (all labs ordered are listed, but only abnormal results are displayed) Labs Reviewed - No data to display  EKG   Radiology No results found.  Procedures Procedures (including critical care time)  Medications Ordered in UC Medications  methylPREDNISolone sodium succinate (SOLU-MEDROL) 125 mg/2 mL injection 60 mg (has no administration in time range)    Initial Impression / Assessment and Plan / UC Course  I have reviewed the triage vital signs and the nursing notes.  Pertinent labs & imaging results that were available during my care of the patient were reviewed by me and considered in my medical decision making (see chart for details).  Poison Ivy dermatitis  1. Methyl prednisone 60 mg IM now 2. Prednisone 40 mg daily for 5 days 3. Continue use of otc calamine lotion for itching, may also use otc antihistamine prn  Final Clinical Impressions(s) / UC Diagnoses   Final diagnoses:  Poison ivy dermatitis     Discharge Instructions     Take 2 prednisone pills every morning for the next 5  days starting tomorrow morning  Can  continue use of calamine lotion over affected area to help with itching  Can also use over-the-counter antihistamine such as Zyrtec or Benadryl to help with itching  Please follow-up if rash continues to spread or worsens after use of medication   ED Prescriptions    Medication Sig Dispense Auth. Provider   predniSONE (DELTASONE) 20 MG tablet Take 2 tablets (40 mg total) by mouth daily. 10 tablet Hans Eden, NP     PDMP not reviewed this encounter.   Hans Eden, NP 05/12/21 1017

## 2021-05-12 NOTE — Discharge Instructions (Signed)
Take 2 prednisone pills every morning for the next 5 days starting tomorrow morning  Can continue use of calamine lotion over affected area to help with itching  Can also use over-the-counter antihistamine such as Zyrtec or Benadryl to help with itching  Please follow-up if rash continues to spread or worsens after use of medication

## 2021-05-12 NOTE — ED Triage Notes (Signed)
Pt presents with poison ivy on both arms and neck X 1 week.

## 2021-06-04 ENCOUNTER — Other Ambulatory Visit (INDEPENDENT_AMBULATORY_CARE_PROVIDER_SITE_OTHER): Payer: No Typology Code available for payment source

## 2021-06-04 ENCOUNTER — Encounter: Payer: Self-pay | Admitting: Internal Medicine

## 2021-06-04 ENCOUNTER — Ambulatory Visit: Payer: No Typology Code available for payment source | Admitting: Internal Medicine

## 2021-06-04 ENCOUNTER — Other Ambulatory Visit (HOSPITAL_COMMUNITY): Payer: Self-pay

## 2021-06-04 VITALS — BP 92/64 | HR 79 | Ht 67.0 in | Wt 193.0 lb

## 2021-06-04 DIAGNOSIS — K51519 Left sided colitis with unspecified complications: Secondary | ICD-10-CM

## 2021-06-04 DIAGNOSIS — K5180 Other ulcerative colitis without complications: Secondary | ICD-10-CM

## 2021-06-04 DIAGNOSIS — K625 Hemorrhage of anus and rectum: Secondary | ICD-10-CM | POA: Diagnosis not present

## 2021-06-04 LAB — CBC WITH DIFFERENTIAL/PLATELET
Basophils Absolute: 0 10*3/uL (ref 0.0–0.1)
Basophils Relative: 0.5 % (ref 0.0–3.0)
Eosinophils Absolute: 0.1 10*3/uL (ref 0.0–0.7)
Eosinophils Relative: 0.9 % (ref 0.0–5.0)
HCT: 43 % (ref 39.0–52.0)
Hemoglobin: 14.9 g/dL (ref 13.0–17.0)
Lymphocytes Relative: 39.7 % (ref 12.0–46.0)
Lymphs Abs: 2.7 10*3/uL (ref 0.7–4.0)
MCHC: 34.7 g/dL (ref 30.0–36.0)
MCV: 90.9 fl (ref 78.0–100.0)
Monocytes Absolute: 0.6 10*3/uL (ref 0.1–1.0)
Monocytes Relative: 8.2 % (ref 3.0–12.0)
Neutro Abs: 3.5 10*3/uL (ref 1.4–7.7)
Neutrophils Relative %: 50.7 % (ref 43.0–77.0)
Platelets: 313 10*3/uL (ref 150.0–400.0)
RBC: 4.73 Mil/uL (ref 4.22–5.81)
RDW: 14 % (ref 11.5–15.5)
WBC: 6.8 10*3/uL (ref 4.0–10.5)

## 2021-06-04 LAB — COMPREHENSIVE METABOLIC PANEL
ALT: 75 U/L — ABNORMAL HIGH (ref 0–53)
AST: 27 U/L (ref 0–37)
Albumin: 4.6 g/dL (ref 3.5–5.2)
Alkaline Phosphatase: 85 U/L (ref 39–117)
BUN: 15 mg/dL (ref 6–23)
CO2: 25 mEq/L (ref 19–32)
Calcium: 9.4 mg/dL (ref 8.4–10.5)
Chloride: 103 mEq/L (ref 96–112)
Creatinine, Ser: 0.85 mg/dL (ref 0.40–1.50)
GFR: 109.84 mL/min (ref >60.00)
Glucose, Bld: 79 mg/dL (ref 70–99)
Potassium: 4 mEq/L (ref 3.5–5.1)
Sodium: 136 mEq/L (ref 135–145)
Total Bilirubin: 0.7 mg/dL (ref 0.2–1.2)
Total Protein: 7.7 g/dL (ref 6.0–8.3)

## 2021-06-04 LAB — C-REACTIVE PROTEIN: CRP: <1 mg/dL (ref 0.5–20.0)

## 2021-06-04 MED ORDER — MESALAMINE 1.2 G PO TBEC
DELAYED_RELEASE_TABLET | ORAL | 3 refills | Status: DC
  Filled 2021-06-04: qty 360, fill #0
  Filled 2021-08-30: qty 360, 90d supply, fill #0
  Filled 2021-12-05: qty 360, 90d supply, fill #1
  Filled 2022-03-12: qty 360, 90d supply, fill #2

## 2021-06-04 NOTE — Progress Notes (Signed)
HISTORY OF PRESENT ILLNESS:  George Morrison is a 40 y.o. male with left-sided ulcerative colitis who presents for follow-up.  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  Colonoscopy in 2015 found the patient to be in remission on medication.  Colonoscopy in 2018 revealed mild patchy left-sided colitis macroscopically and microscopically.  He has continued on Lialda 4.8 g daily.  He will use Rowasa enemas on demand, but not anytime recently.  Most part he does well.  He will describe occasional problems with bloating and rectal bleeding which may last for a few days and resolved.  He does take Metamucil daily which he states helps his bowel habits.  He is compliant with Lialda.  No other complaints.  He does inquire about his next surveillance colonoscopy  REVIEW OF SYSTEMS:  All non-GI ROS negative unless otherwise stated in the HPI.  Past Medical History:  Diagnosis Date   Anal fissure    Appendicitis    Ulcerative colitis     Past Surgical History:  Procedure Laterality Date   GANGLION CYST EXCISION     left hand   KNEE ARTHROSCOPY W/ ACL RECONSTRUCTION     LAPAROSCOPIC APPENDECTOMY N/A 08/17/2015   Procedure: APPENDECTOMY LAPAROSCOPIC;  Surgeon: Michael Boston, MD;  Location: [facility];  Service: General;  Laterality: N/A;    Social History George Morrison  reports that he has never smoked. He has never used smokeless tobacco. He reports current alcohol use. He reports that he does not use drugs.  family history includes Cancer in an other family member; Cirrhosis in an other family member; Diabetes in his mother; Heart disease in his maternal grandfather; Hepatitis in an other family member; Liver disease in an other family member; Testicular cancer in his maternal grandfather.  Allergies  Allergen Reactions   Codeine Rash and Other (See Comments)    Reaction:  Dizziness        PHYSICAL EXAMINATION: Vital signs: BP 92/64   Pulse 79   Ht  5' 7"  (1.702 m)   Wt 193 lb (87.5 kg)   SpO2 95%   BMI 30.23 kg/m   Constitutional: generally well-appearing, no acute distress Psychiatric: alert and oriented x3, cooperative Eyes: extraocular movements intact, anicteric, conjunctiva pink Mouth: oral pharynx moist, no lesions Neck: supple no lymphadenopathy Cardiovascular: heart regular rate and rhythm, no murmur Lungs: clear to auscultation bilaterally Abdomen: soft, nontender, nondistended, no obvious ascites, no peritoneal signs, normal bowel sounds, no organomegaly Rectal: Omitted Extremities: no clubbing, cyanosis, or lower extremity edema bilaterally Skin: no lesions on visible extremities Neuro: No focal deficits.  Cranial nerves intact  ASSESSMENT:  1.  Left-sided ulcerative colitis diagnosed 2010.  Last colonoscopy 2018 with mild patchy left-sided disease.  Clinically with occasional mild symptoms but otherwise well.   PLAN:  1.  CBC, comprehensive metabolic panel, C-reactive protein today 2.  Continue Lialda 4.8 g daily.  Prescription renewed.  Medication risks reviewed 3.  Continue Rowasa enemas on demand 4.  Routine office follow-up 1 year.  He knows to contact the office in the interim for any questions or clinical problems. 5.  Routine repeat surveillance colonoscopy around August 2023

## 2021-06-04 NOTE — Patient Instructions (Signed)
If you are age 39 or older, your body mass index should be between 23-30. Your Body mass index is 30.23 kg/m. If this is out of the aforementioned range listed, please consider follow up with your Primary Care Provider.  If you are age 49 or younger, your body mass index should be between 19-25. Your Body mass index is 30.23 kg/m. If this is out of the aformentioned range listed, please consider follow up with your Primary Care Provider.   __________________________________________________________  The [facility] providers would like to encourage you to use [facility] to communicate with providers for non-urgent requests or questions.  Due to long hold times on the telephone, sending your provider a message by [facility] may be a faster and more efficient way to get a response.  Please allow 48 business hours for a response.  Please remember that this is for non-urgent requests.   Your provider has requested that you go to the basement level for lab work before leaving today. Press "B" on the elevator. The lab is located at the first door on the left as you exit the elevator.  We have sent the following medications to your pharmacy for you to pick up at your convenience:  Lialda

## 2021-08-30 ENCOUNTER — Other Ambulatory Visit (HOSPITAL_COMMUNITY): Payer: Self-pay

## 2021-12-05 ENCOUNTER — Other Ambulatory Visit (HOSPITAL_COMMUNITY): Payer: Self-pay

## 2022-03-12 ENCOUNTER — Other Ambulatory Visit (HOSPITAL_COMMUNITY): Payer: Self-pay

## 2022-06-05 ENCOUNTER — Other Ambulatory Visit (HOSPITAL_COMMUNITY): Payer: Self-pay

## 2022-06-05 MED ORDER — MECLIZINE HCL 25 MG PO TABS
ORAL_TABLET | ORAL | 1 refills | Status: DC
  Filled 2022-06-05: qty 15, 5d supply, fill #0
  Filled 2022-06-13: qty 15, 5d supply, fill #1

## 2022-06-05 MED ORDER — ONDANSETRON 4 MG PO TBDP
ORAL_TABLET | ORAL | 1 refills | Status: DC
  Filled 2022-06-05: qty 9, 3d supply, fill #0

## 2022-06-13 ENCOUNTER — Other Ambulatory Visit (HOSPITAL_COMMUNITY): Payer: Self-pay

## 2022-06-15 ENCOUNTER — Other Ambulatory Visit (HOSPITAL_BASED_OUTPATIENT_CLINIC_OR_DEPARTMENT_OTHER): Payer: Self-pay | Admitting: Family Medicine

## 2022-06-15 DIAGNOSIS — R519 Headache, unspecified: Secondary | ICD-10-CM

## 2022-06-15 DIAGNOSIS — R42 Dizziness and giddiness: Secondary | ICD-10-CM

## 2022-06-15 DIAGNOSIS — H539 Unspecified visual disturbance: Secondary | ICD-10-CM

## 2022-06-19 ENCOUNTER — Ambulatory Visit (HOSPITAL_BASED_OUTPATIENT_CLINIC_OR_DEPARTMENT_OTHER)
Admission: RE | Admit: 2022-06-19 | Discharge: 2022-06-19 | Disposition: A | Discharge status: Home / Self Care | Payer: No Typology Code available for payment source | Source: Ambulatory Visit | Attending: Family Medicine | Admitting: Family Medicine

## 2022-06-19 DIAGNOSIS — R42 Dizziness and giddiness: Secondary | ICD-10-CM | POA: Diagnosis present

## 2022-06-19 DIAGNOSIS — H539 Unspecified visual disturbance: Secondary | ICD-10-CM | POA: Insufficient documentation

## 2022-06-19 DIAGNOSIS — R519 Headache, unspecified: Secondary | ICD-10-CM | POA: Insufficient documentation

## 2022-08-13 ENCOUNTER — Encounter: Payer: Self-pay | Admitting: Internal Medicine

## 2022-08-14 ENCOUNTER — Encounter: Payer: Self-pay | Admitting: Internal Medicine

## 2022-09-04 ENCOUNTER — Other Ambulatory Visit (HOSPITAL_COMMUNITY): Payer: Self-pay

## 2022-09-04 ENCOUNTER — Ambulatory Visit (AMBULATORY_SURGERY_CENTER): Payer: No Typology Code available for payment source

## 2022-09-04 VITALS — Ht 67.0 in | Wt 188.0 lb

## 2022-09-04 DIAGNOSIS — K5150 Left sided colitis without complications: Secondary | ICD-10-CM

## 2022-09-04 MED ORDER — NA SULFATE-K SULFATE-MG SULF 17.5-3.13-1.6 GM/177ML PO SOLN
1.0000 | ORAL | 0 refills | Status: DC
  Filled 2022-09-04: qty 354, 2d supply, fill #0
  Filled 2022-10-05: qty 354, 1d supply, fill #0

## 2022-09-04 NOTE — Progress Notes (Signed)
No egg or soy allergy known to patient  No issues known to pt with past sedation with any surgeries or procedures Patient denies ever being told they had issues or difficulty with intubation  No FH of Malignant Hyperthermia Pt is not on diet pills Pt is not on  home 02  Pt is not on blood thinners  Pt denies issues with constipation  No A fib or A flutter Have any cardiac testing pending--denied Pt instructed to use Single[employer]com or GoodRx for a price reduction on prep

## 2022-09-16 ENCOUNTER — Other Ambulatory Visit (HOSPITAL_COMMUNITY): Payer: Self-pay

## 2022-09-18 ENCOUNTER — Other Ambulatory Visit (HOSPITAL_COMMUNITY): Payer: Self-pay

## 2022-09-18 ENCOUNTER — Other Ambulatory Visit: Payer: Self-pay | Admitting: Internal Medicine

## 2022-09-18 MED ORDER — MESALAMINE 1.2 G PO TBEC
DELAYED_RELEASE_TABLET | ORAL | 3 refills | Status: DC
  Filled 2022-09-18: qty 360, 90d supply, fill #0
  Filled 2023-01-05: qty 360, 90d supply, fill #1
  Filled 2023-05-13: qty 360, 90d supply, fill #2

## 2022-09-24 ENCOUNTER — Other Ambulatory Visit (HOSPITAL_COMMUNITY): Payer: Self-pay

## 2022-10-05 ENCOUNTER — Other Ambulatory Visit (HOSPITAL_COMMUNITY): Payer: Self-pay

## 2022-10-07 ENCOUNTER — Other Ambulatory Visit (HOSPITAL_COMMUNITY): Payer: Self-pay

## 2022-10-09 ENCOUNTER — Ambulatory Visit (AMBULATORY_SURGERY_CENTER): Payer: No Typology Code available for payment source | Admitting: Internal Medicine

## 2022-10-09 ENCOUNTER — Encounter: Payer: No Typology Code available for payment source | Admitting: Internal Medicine

## 2022-10-09 ENCOUNTER — Encounter: Payer: Self-pay | Admitting: Internal Medicine

## 2022-10-09 VITALS — BP 103/72 | HR 65 | Temp 98.6°F | Resp 17 | Ht 67.0 in | Wt 188.0 lb

## 2022-10-09 DIAGNOSIS — K5150 Left sided colitis without complications: Secondary | ICD-10-CM | POA: Diagnosis not present

## 2022-10-09 DIAGNOSIS — K51519 Left sided colitis with unspecified complications: Secondary | ICD-10-CM | POA: Diagnosis not present

## 2022-10-09 DIAGNOSIS — Z1211 Encounter for screening for malignant neoplasm of colon: Secondary | ICD-10-CM | POA: Diagnosis not present

## 2022-10-09 DIAGNOSIS — Z79899 Other long term (current) drug therapy: Secondary | ICD-10-CM

## 2022-10-09 MED ORDER — SODIUM CHLORIDE 0.9 % IV SOLN: 500.0000 mL | Freq: Once | INTRAVENOUS | Status: DC

## 2022-10-09 NOTE — Patient Instructions (Signed)
   Resume previous diet.  Continue present medications.  Office visit in 1 year.  Await pathology results.  Repeat colonoscopy in 5 years for surveillance.   YOU HAD AN ENDOSCOPIC PROCEDURE TODAY AT [facility]:   Refer to the procedure report that was given to you for any specific questions about what was found during the examination.  If the procedure report does not answer your questions, please call your gastroenterologist to clarify.  If you requested that your care partner not be given the details of your procedure findings, then the procedure report has been included in a sealed envelope for you to review at your convenience later.  YOU SHOULD EXPECT: Some feelings of bloating in the abdomen. Passage of more gas than usual.  Walking can help get rid of the air that was put into your GI tract during the procedure and reduce the bloating. If you had a lower endoscopy (such as a colonoscopy or flexible sigmoidoscopy) you may notice spotting of blood in your stool or on the toilet paper. If you underwent a bowel prep for your procedure, you may not have a normal bowel movement for a few days.  Please Note:  You might notice some irritation and congestion in your nose or some drainage.  This is from the oxygen used during your procedure.  There is no need for concern and it should clear up in a day or so.  SYMPTOMS TO REPORT IMMEDIATELY:  Following lower endoscopy (colonoscopy or flexible sigmoidoscopy):  Excessive amounts of blood in the stool  Significant tenderness or worsening of abdominal pains  Swelling of the abdomen that is new, acute  Fever of 100F or higher   For urgent or emergent issues, a gastroenterologist can be reached at any hour by calling [phone]. Do not use MyChart messaging for urgent concerns.    DIET:  We do recommend a small meal at first, but then you may proceed to your regular diet.  Drink plenty of fluids but you should avoid alcoholic  beverages for 24 hours.  ACTIVITY:  You should plan to take it easy for the rest of today and you should NOT DRIVE or use heavy machinery until tomorrow (because of the sedation medicines used during the test).    FOLLOW UP: Our staff will call the number listed on your records the next business day following your procedure.  We will call around 7:15- 8:00 am to check on you and address any questions or concerns that you may have regarding the information given to you following your procedure. If we do not reach you, we will leave a message.     If any biopsies were taken you will be contacted by phone or by letter within the next 1-3 weeks.  Please call us at [phone] if you have not heard about the biopsies in 3 weeks.    SIGNATURES/CONFIDENTIALITY: You and/or your care partner have signed paperwork which will be entered into your electronic medical record.  These signatures attest to the fact that that the information above on your After Visit Summary has been reviewed and is understood.  Full responsibility of the confidentiality of this discharge information lies with you and/or your care-partner.

## 2022-10-09 NOTE — Progress Notes (Signed)
HISTORY OF PRESENT ILLNESS:   George Morrison is a 41 y.o. male with left-sided ulcerative colitis who presents for follow-up.  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  Colonoscopy in 2015 found the patient to be in remission on medication.  Colonoscopy in 2018 revealed mild patchy left-sided colitis macroscopically and microscopically.  He has continued on Lialda 4.8 g daily.  He will use Rowasa enemas on demand, but not anytime recently.  Most part he does well.  He will describe occasional problems with bloating and rectal bleeding which may last for a few days and resolved.  He does take Metamucil daily which he states helps his bowel habits.  He is compliant with Lialda.  No other complaints.  He does inquire about his next surveillance colonoscopy   REVIEW OF SYSTEMS:   All non-GI ROS negative unless otherwise stated in the HPI.       Past Medical History:  Diagnosis Date   Anal fissure     Appendicitis     Ulcerative colitis             Past Surgical History:  Procedure Laterality Date   GANGLION CYST EXCISION        left hand   KNEE ARTHROSCOPY W/ ACL RECONSTRUCTION       LAPAROSCOPIC APPENDECTOMY N/A 08/17/2015    Procedure: APPENDECTOMY LAPAROSCOPIC;  Surgeon: Michael Boston, MD;  Location: [facility];  Service: General;  Laterality: N/A;      Social History George Morrison  reports that he has never smoked. He has never used smokeless tobacco. He reports current alcohol use. He reports that he does not use drugs.   family history includes Cancer in an other family member; Cirrhosis in an other family member; Diabetes in his mother; Heart disease in his maternal grandfather; Hepatitis in an other family member; Liver disease in an other family member; Testicular cancer in his maternal grandfather.        Allergies  Allergen Reactions   Codeine Rash and Other (See Comments)      Reaction:  Dizziness           PHYSICAL  EXAMINATION: Vital signs: BP 92/64   Pulse 79   Ht 5' 7"  (1.702 m)   Wt 193 lb (87.5 kg)   SpO2 95%   BMI 30.23 kg/m   Constitutional: generally well-appearing, no acute distress Psychiatric: alert and oriented x3, cooperative Eyes: extraocular movements intact, anicteric, conjunctiva pink Mouth: oral pharynx moist, no lesions Neck: supple no lymphadenopathy Cardiovascular: heart regular rate and rhythm, no murmur Lungs: clear to auscultation bilaterally Abdomen: soft, nontender, nondistended, no obvious ascites, no peritoneal signs, normal bowel sounds, no organomegaly Rectal: Omitted Extremities: no clubbing, cyanosis, or lower extremity edema bilaterally Skin: no lesions on visible extremities Neuro: No focal deficits.  Cranial nerves intact   ASSESSMENT:   1.  Left-sided ulcerative colitis diagnosed 2010.  Last colonoscopy 2018 with mild patchy left-sided disease.  Clinically with occasional mild symptoms but otherwise well.     PLAN:   1.  CBC, comprehensive metabolic panel, C-reactive protein today 2.  Continue Lialda 4.8 g daily.  Prescription renewed.  Medication risks reviewed 3.  Continue Rowasa enemas on demand 4.  Routine office follow-up 1 year.  He knows to contact the office in the interim for any questions or clinical problems. 5.  Routine repeat surveillance colonoscopy around August 2023

## 2022-10-09 NOTE — Progress Notes (Signed)
Pt resting comfortably. VSS. Airway intact. SBAR complete to RN. All questions answered.

## 2022-10-09 NOTE — Progress Notes (Signed)
Pt's states no medical or surgical changes since previsit or office visit. 

## 2022-10-09 NOTE — Op Note (Signed)
[facility] Patient Name: George Morrison Procedure Date: 10/09/2022 8:51 AM MRN: [id number] Endoscopist: Docia Chuck. Henrene Pastor , MD Age: 41 Referring MD:  Date of Birth: 1981-12-16 Gender: Male Account #: [id number] Procedure:                Colonoscopy with biopsies Indications:              High risk colon cancer surveillance: Ulcerative                            left sided colitis of 8 (or more) years duration.                            Diagnosed 2010. Last colonoscopy 2018. Currently                            without complaints on Lialda 4.8 g daily Medicines:                Monitored Anesthesia Care Procedure:                Pre-Anesthesia Assessment:                           - Prior to the procedure, a History and Physical                            was performed, and patient medications and                            allergies were reviewed. The patient's tolerance of                            previous anesthesia was also reviewed. The risks                            and benefits of the procedure and the sedation                            options and risks were discussed with the patient.                            All questions were answered, and informed consent                            was obtained. Prior Anticoagulants: The patient has                            taken no previous anticoagulant or antiplatelet                            agents. ASA Grade Assessment: II - A patient with                            mild systemic disease. After reviewing the risks  and benefits, the patient was deemed in                            satisfactory condition to undergo the procedure.                           After obtaining informed consent, the colonoscope                            was passed under direct vision. Throughout the                            procedure, the patient's blood pressure, pulse, and                            oxygen  saturations were monitored continuously. The                            CF [id number] #[id number] was introduced through the anus                            and advanced to the the cecum, identified by                            appendiceal orifice and ileocecal valve. The                            ileocecal valve, appendiceal orifice, and rectum                            were photographed. The quality of the bowel                            preparation was excellent. The colonoscopy was                            performed without difficulty. The patient tolerated                            the procedure well. The bowel preparation used was                            SUPREP via split dose instruction. Scope In: 9:00:05 AM Scope Out: 9:10:32 AM Scope Withdrawal Time: 0 hours 9 minutes 4 seconds  Total Procedure Duration: 0 hours 10 minutes 27 seconds  Findings:                 The entire examined colon appeared normal on direct                            and retroflexion views. Four biopsies were taken                            every 10 cm with a cold forceps from the  descending                            colon, sigmoid colon and rectum for ulcerative                            colitis surveillance. These biopsy specimens were                            sent to Pathology. Small internal hemorrhoids noted. Complications:            No immediate complications. Estimated blood loss:                            None. Estimated Blood Loss:     Estimated blood loss: none. Impression:               - The entire examined colon is normal on direct and                            retroflexion views. Recommendation:           - Repeat colonoscopy in 5 years for surveillance.                           - Patient has a contact number available for                            emergencies. The signs and symptoms of potential                            delayed complications were discussed with the                             patient. Return to normal activities tomorrow.                            Written discharge instructions were provided to the                            patient.                           - Resume previous diet.                           - Continue present medications.                           ?" Office visit 1 year                           - Await pathology results. Docia Chuck. Henrene Pastor, MD 10/09/2022 9:23:27 AM This report has been signed electronically.

## 2022-10-09 NOTE — Progress Notes (Signed)
Called to room to assist during endoscopic procedure.  Patient ID and intended procedure confirmed with present staff. Received instructions for my participation in the procedure from the performing physician.  

## 2022-10-12 ENCOUNTER — Telehealth: Payer: Self-pay | Admitting: *Deleted

## 2022-10-12 NOTE — Telephone Encounter (Signed)
  Follow up Call-     10/09/2022    7:42 AM  Call back number  Post procedure Call Back phone  # [phone]  Permission to leave phone message Yes     Patient questions:  Do you have a fever, pain , or abdominal swelling? No. Pain Score  0 *  Have you tolerated food without any problems? Yes.    Have you been able to return to your normal activities? Yes.    Do you have any questions about your discharge instructions: Diet   No. Medications  No. Follow up visit  No.  Do you have questions or concerns about your Care? No.  Actions: * If pain score is 4 or above: No action needed, pain <4.

## 2022-10-13 ENCOUNTER — Encounter: Payer: Self-pay | Admitting: Internal Medicine

## 2023-01-07 ENCOUNTER — Other Ambulatory Visit: Payer: Self-pay

## 2023-01-07 ENCOUNTER — Other Ambulatory Visit (HOSPITAL_COMMUNITY): Payer: Self-pay

## 2023-01-11 ENCOUNTER — Other Ambulatory Visit (HOSPITAL_COMMUNITY): Payer: Self-pay

## 2023-05-14 ENCOUNTER — Other Ambulatory Visit: Payer: Self-pay

## 2023-09-25 ENCOUNTER — Other Ambulatory Visit: Payer: Self-pay | Admitting: Internal Medicine

## 2023-09-27 ENCOUNTER — Other Ambulatory Visit (HOSPITAL_COMMUNITY): Payer: Self-pay

## 2023-09-27 MED ORDER — MESALAMINE 1.2 G PO TBEC
DELAYED_RELEASE_TABLET | ORAL | 0 refills | Status: DC
  Filled 2023-09-27: qty 360, fill #0

## 2023-09-28 ENCOUNTER — Other Ambulatory Visit (HOSPITAL_COMMUNITY): Payer: Self-pay

## 2023-09-28 ENCOUNTER — Other Ambulatory Visit: Payer: Self-pay

## 2023-09-28 MED ORDER — MESALAMINE 1.2 G PO TBEC
DELAYED_RELEASE_TABLET | ORAL | 1 refills | Status: DC
  Filled 2023-09-28: qty 360, 90d supply, fill #0

## 2023-09-28 NOTE — Addendum Note (Signed)
Addended by: Jeanine Luz on: 09/28/2023 02:37 PM   Modules accepted: Orders

## 2023-09-28 NOTE — Telephone Encounter (Signed)
PT needs a refill on mesalamine. He has schedule an OV for 10/22 but will run out of the medication in 2 days. Please advise.

## 2023-10-12 ENCOUNTER — Ambulatory Visit: Payer: 59 | Admitting: Internal Medicine

## 2023-10-12 ENCOUNTER — Encounter: Payer: Self-pay | Admitting: Internal Medicine

## 2023-10-12 ENCOUNTER — Other Ambulatory Visit: Payer: Self-pay

## 2023-10-12 ENCOUNTER — Other Ambulatory Visit (HOSPITAL_COMMUNITY): Payer: Self-pay

## 2023-10-12 VITALS — BP 110/80 | HR 86 | Ht 67.0 in | Wt 205.0 lb

## 2023-10-12 DIAGNOSIS — K51519 Left sided colitis with unspecified complications: Secondary | ICD-10-CM

## 2023-10-12 MED ORDER — MESALAMINE 1.2 G PO TBEC
2.4000 g | DELAYED_RELEASE_TABLET | Freq: Two times a day (BID) | ORAL | 3 refills | Status: DC
  Filled 2023-10-12 – 2024-01-26 (×3): qty 360, 90d supply, fill #0
  Filled 2024-06-06: qty 360, 90d supply, fill #1
  Filled 2024-09-05: qty 360, 90d supply, fill #2

## 2023-10-12 MED ORDER — MESALAMINE 4 G RE ENEM
4.0000 g | ENEMA | Freq: Every day | RECTAL | 11 refills | Status: AC
Start: 2023-10-12 — End: ?
  Filled 2023-10-12: qty 1680, 28d supply, fill #0

## 2023-10-12 NOTE — Patient Instructions (Signed)
We have sent the following medications to your pharmacy for you to pick up at your convenience:  Lialda, Rowasa.  Please follow up in one year.  _______________________________________________________  If your blood pressure at your visit was 140/90 or greater, please contact your primary care physician to follow up on this.  _______________________________________________________  If you are age 41 or older, your body mass index should be between 23-30. Your Body mass index is 32.11 kg/m. If this is out of the aforementioned range listed, please consider follow up with your Primary Care Provider.  If you are age 8 or younger, your body mass index should be between 19-25. Your Body mass index is 32.11 kg/m. If this is out of the aformentioned range listed, please consider follow up with your Primary Care Provider.   ________________________________________________________  The [facility] providers would like to encourage you to use [facility] to communicate with providers for non-urgent requests or questions.  Due to long hold times on the telephone, sending your provider a message by [facility] may be a faster and more efficient way to get a response.  Please allow 48 business hours for a response.  Please remember that this is for non-urgent requests.  _______________________________________________________

## 2023-10-12 NOTE — Progress Notes (Signed)
HISTORY OF PRESENT ILLNESS:  George Morrison is a 42 y.o. male with left-sided ulcerative colitis who presents for follow-up. 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 Colonoscopy in 2015 found the patient to be in remission on medication. Colonoscopy in 2018 revealed mild patchy left-sided colitis macroscopically and microscopically. He has continued on Lialda 4.8 g daily. He will use Rowasa enemas on demand, but not anytime recently. Most part he does well. He will describe occasional problems with bloating and rectal bleeding which may last for a few days and resolved. He does take Metamucil daily which he states helps his bowel habits. He is compliant with Lialda. No other complaints. He does inquire about his next surveillance colonoscopy.  He request medication refill.  He was last seen in the office June 04, 2021.  Last underwent surveillance colonoscopy September 29, 2022.  The entire examined colon was normal as were biopsies.  His last blood work in this office was 2 years ago.  He tells me that he undergoes annual comprehensive blood work with his PCP annually.  He is due for the same this year.  REVIEW OF SYSTEMS:  All non-GI ROS negative unless otherwise stated in the HPI. Past Medical History:  Diagnosis Date   Anal fissure    Appendicitis    Ulcerative colitis     Past Surgical History:  Procedure Laterality Date   GANGLION CYST EXCISION     left hand   KNEE ARTHROSCOPY W/ ACL RECONSTRUCTION     LAPAROSCOPIC APPENDECTOMY N/A 08/17/2015   Procedure: APPENDECTOMY LAPAROSCOPIC;  Surgeon: Karie Soda, MD;  Location: [facility];  Service: General;  Laterality: N/A;    Social History Toddrick Zisha Montjoy  reports that he has never smoked. He has never used smokeless tobacco. He reports current alcohol use. He reports that he does not use drugs.  family history includes Cancer in an other family member; Cirrhosis in an other family member; Diabetes  in his mother; Heart disease in his maternal grandfather; Hepatitis in an other family member; Liver disease in an other family member; Testicular cancer in his maternal grandfather.  Allergies  Allergen Reactions   Codeine Rash and Other (See Comments)    Reaction:  Dizziness        PHYSICAL EXAMINATION: Vital signs: BP 110/80   Pulse 86   Ht 5\' 7"  (1.702 m)   Wt 205 lb (93 kg)   BMI 32.11 kg/m   Constitutional: generally well-appearing, no acute distress Psychiatric: alert and oriented x3, cooperative Eyes: extraocular movements intact, anicteric, conjunctiva pink Mouth: oral pharynx moist, no lesions Neck: supple no lymphadenopathy Cardiovascular: heart regular rate and rhythm, no murmur Lungs: clear to auscultation bilaterally Abdomen: soft, nontender, nondistended, no obvious ascites, no peritoneal signs, normal bowel sounds, no organomegaly Rectal: Omitted Extremities: no clubbing, cyanosis, or lower extremity edema bilaterally Skin: no lesions on visible extremities Neuro: No focal deficits. No asterixis.    ASSESSMENT:   1.  Left-sided ulcerative colitis diagnosed 2010.  Last colonoscopy October 2023 was normal endoscopically and microscopically.  Minimal occasional symptoms  PLAN:   1.  Work with PCP 2.  Continue Lialda 4.8 g daily.  Prescription renewed.  Medication risks reviewed 3.  Continue Rowasa enemas on demand.  Refilled. 4.  Routine office follow-up 1 year.  He knows to contact the office in the interim for any questions or clinical problems. 5.  Routine repeat surveillance colonoscopy around August 2023 A total time  of 30 minutes was spent preparing to see the patient, obtaining comprehensive history, performing medically appropriate physical examination, counseling and educating the patient regarding the above listed issues, ordering multiple medications, defining follow-up parameters, and documenting clinical information in the health record

## 2023-10-25 ENCOUNTER — Other Ambulatory Visit (HOSPITAL_COMMUNITY): Payer: Self-pay

## 2024-01-05 ENCOUNTER — Other Ambulatory Visit (HOSPITAL_COMMUNITY): Payer: Self-pay

## 2024-01-17 ENCOUNTER — Other Ambulatory Visit (HOSPITAL_COMMUNITY): Payer: Self-pay

## 2024-01-26 ENCOUNTER — Other Ambulatory Visit (HOSPITAL_COMMUNITY): Payer: Self-pay

## 2024-01-31 ENCOUNTER — Other Ambulatory Visit (HOSPITAL_COMMUNITY): Payer: Self-pay

## 2024-02-08 DIAGNOSIS — M25562 Pain in left knee: Secondary | ICD-10-CM | POA: Diagnosis not present

## 2024-03-03 DIAGNOSIS — M25562 Pain in left knee: Secondary | ICD-10-CM | POA: Diagnosis not present

## 2024-03-09 DIAGNOSIS — S83242A Other tear of medial meniscus, current injury, left knee, initial encounter: Secondary | ICD-10-CM | POA: Diagnosis not present

## 2024-03-24 DIAGNOSIS — E782 Mixed hyperlipidemia: Secondary | ICD-10-CM | POA: Diagnosis not present

## 2024-03-24 DIAGNOSIS — R7303 Prediabetes: Secondary | ICD-10-CM | POA: Diagnosis not present

## 2024-03-24 DIAGNOSIS — K5190 Ulcerative colitis, unspecified, without complications: Secondary | ICD-10-CM | POA: Diagnosis not present

## 2024-03-24 DIAGNOSIS — Z0000 Encounter for general adult medical examination without abnormal findings: Secondary | ICD-10-CM | POA: Diagnosis not present

## 2024-05-25 ENCOUNTER — Other Ambulatory Visit (HOSPITAL_COMMUNITY): Payer: Self-pay

## 2024-05-25 DIAGNOSIS — L237 Allergic contact dermatitis due to plants, except food: Secondary | ICD-10-CM | POA: Diagnosis not present

## 2024-05-25 MED ORDER — PREDNISONE 50 MG PO TABS
50.0000 mg | ORAL_TABLET | Freq: Every day | ORAL | 0 refills | Status: AC
  Filled 2024-05-25: qty 5, 5d supply, fill #0

## 2024-06-06 DIAGNOSIS — L255 Unspecified contact dermatitis due to plants, except food: Secondary | ICD-10-CM | POA: Diagnosis not present

## 2024-06-07 ENCOUNTER — Other Ambulatory Visit (HOSPITAL_BASED_OUTPATIENT_CLINIC_OR_DEPARTMENT_OTHER): Payer: Self-pay

## 2024-06-07 ENCOUNTER — Other Ambulatory Visit (HOSPITAL_COMMUNITY): Payer: Self-pay

## 2024-06-07 MED ORDER — HYDROXYZINE HCL 25 MG PO TABS
25.0000 mg | ORAL_TABLET | Freq: Three times a day (TID) | ORAL | 0 refills | Status: AC
  Filled 2024-06-07: qty 21, 7d supply, fill #0

## 2024-06-07 MED ORDER — PREDNISONE 10 MG PO TABS
ORAL_TABLET | ORAL | 0 refills | Status: AC
  Filled 2024-06-07: qty 42, 12d supply, fill #0

## 2024-09-05 ENCOUNTER — Other Ambulatory Visit (HOSPITAL_COMMUNITY): Payer: Self-pay

## 2024-11-25 ENCOUNTER — Other Ambulatory Visit: Payer: Self-pay | Admitting: Internal Medicine

## 2024-11-27 ENCOUNTER — Other Ambulatory Visit (HOSPITAL_COMMUNITY): Payer: Self-pay

## 2024-11-27 MED ORDER — MESALAMINE 1.2 G PO TBEC
2.4000 g | DELAYED_RELEASE_TABLET | Freq: Two times a day (BID) | ORAL | 0 refills | Status: AC
  Filled 2024-11-27 – 2025-01-05 (×4): qty 360, 90d supply, fill #0

## 2024-12-01 ENCOUNTER — Other Ambulatory Visit (HOSPITAL_COMMUNITY): Payer: Self-pay

## 2024-12-07 ENCOUNTER — Other Ambulatory Visit (HOSPITAL_COMMUNITY): Payer: Self-pay

## 2024-12-16 ENCOUNTER — Other Ambulatory Visit (HOSPITAL_COMMUNITY): Payer: Self-pay

## 2025-01-04 ENCOUNTER — Other Ambulatory Visit (HOSPITAL_COMMUNITY): Payer: Self-pay

## 2025-01-05 ENCOUNTER — Other Ambulatory Visit (HOSPITAL_COMMUNITY): Payer: Self-pay

## 2025-01-19 ENCOUNTER — Other Ambulatory Visit (HOSPITAL_COMMUNITY): Payer: Self-pay
